# Patient Record
Sex: Female | Born: 1940 | Race: White | Hispanic: No | Marital: Married | Smoking: Current every day smoker
Health system: Southern US, Community
[De-identification: ages and names within clinical notes are randomized; demographics above are authoritative.]

## PROBLEM LIST (undated history)

## (undated) DIAGNOSIS — I10 Essential (primary) hypertension: Secondary | ICD-10-CM

## (undated) DIAGNOSIS — F419 Anxiety disorder, unspecified: Secondary | ICD-10-CM

## (undated) DIAGNOSIS — I428 Other cardiomyopathies: Secondary | ICD-10-CM

## (undated) DIAGNOSIS — E7800 Pure hypercholesterolemia, unspecified: Secondary | ICD-10-CM

## (undated) DIAGNOSIS — Z86718 Personal history of other venous thrombosis and embolism: Secondary | ICD-10-CM

## (undated) DIAGNOSIS — I509 Heart failure, unspecified: Secondary | ICD-10-CM

## (undated) DIAGNOSIS — J449 Chronic obstructive pulmonary disease, unspecified: Secondary | ICD-10-CM

## (undated) DIAGNOSIS — I219 Acute myocardial infarction, unspecified: Secondary | ICD-10-CM

## (undated) DIAGNOSIS — I2510 Atherosclerotic heart disease of native coronary artery without angina pectoris: Secondary | ICD-10-CM

## (undated) DIAGNOSIS — F329 Major depressive disorder, single episode, unspecified: Secondary | ICD-10-CM

## (undated) HISTORY — PX: CARDIAC DEFIBRILLATOR PLACEMENT: SHX171

## (undated) HISTORY — PX: SPLENECTOMY: SUR1306

## (undated) HISTORY — DX: Heart failure, unspecified: I50.9

## (undated) HISTORY — DX: Pure hypercholesterolemia, unspecified: E78.00

## (undated) HISTORY — DX: Personal history of other venous thrombosis and embolism: Z86.718

## (undated) HISTORY — DX: Atherosclerotic heart disease of native coronary artery without angina pectoris: I25.10

## (undated) HISTORY — DX: Anxiety disorder, unspecified: F41.9

## (undated) HISTORY — DX: Other cardiomyopathies: I42.8

## (undated) HISTORY — DX: Essential (primary) hypertension: I10

## (undated) HISTORY — DX: Acute myocardial infarction, unspecified: I21.9

## (undated) HISTORY — DX: Chronic obstructive pulmonary disease, unspecified: J44.9

## (undated) HISTORY — DX: Major depressive disorder, single episode, unspecified: F32.9

## (undated) HISTORY — PX: ESOPHAGUS SURGERY: SHX626

## (undated) HISTORY — PX: CHOLECYSTECTOMY: SHX55

## (undated) HISTORY — PX: OTHER SURGICAL HISTORY: SHX169

## (undated) HISTORY — PX: TOTAL ABDOMINAL HYSTERECTOMY: SHX209

---

## 2005-09-24 ENCOUNTER — Ambulatory Visit: Payer: Self-pay | Admitting: Internal Medicine

## 2005-11-03 HISTORY — PX: TRANSTHORACIC ECHOCARDIOGRAM: SHX275

## 2005-11-20 ENCOUNTER — Inpatient Hospital Stay (HOSPITAL_COMMUNITY): Admission: EM | Admit: 2005-11-20 | Discharge: 2005-11-23 | Payer: Self-pay | Admitting: Emergency Medicine

## 2005-11-21 ENCOUNTER — Encounter (INDEPENDENT_AMBULATORY_CARE_PROVIDER_SITE_OTHER): Payer: Self-pay | Admitting: Cardiology

## 2006-01-07 ENCOUNTER — Ambulatory Visit: Payer: Self-pay | Admitting: Internal Medicine

## 2006-04-09 ENCOUNTER — Ambulatory Visit: Payer: Self-pay | Admitting: Internal Medicine

## 2006-07-09 ENCOUNTER — Emergency Department (HOSPITAL_COMMUNITY): Admission: EM | Admit: 2006-07-09 | Discharge: 2006-07-09 | Payer: Self-pay | Admitting: Emergency Medicine

## 2006-08-11 ENCOUNTER — Ambulatory Visit: Payer: Self-pay | Admitting: Internal Medicine

## 2007-02-09 ENCOUNTER — Ambulatory Visit: Payer: Self-pay | Admitting: Internal Medicine

## 2007-03-29 ENCOUNTER — Emergency Department (HOSPITAL_COMMUNITY): Admission: EM | Admit: 2007-03-29 | Discharge: 2007-03-29 | Payer: Self-pay | Admitting: Emergency Medicine

## 2007-06-06 ENCOUNTER — Emergency Department (HOSPITAL_COMMUNITY): Admission: EM | Admit: 2007-06-06 | Discharge: 2007-06-06 | Payer: Self-pay | Admitting: Emergency Medicine

## 2007-08-12 DIAGNOSIS — F17200 Nicotine dependence, unspecified, uncomplicated: Secondary | ICD-10-CM

## 2007-08-12 DIAGNOSIS — J449 Chronic obstructive pulmonary disease, unspecified: Secondary | ICD-10-CM

## 2007-08-12 DIAGNOSIS — I428 Other cardiomyopathies: Secondary | ICD-10-CM | POA: Insufficient documentation

## 2007-08-12 DIAGNOSIS — I951 Orthostatic hypotension: Secondary | ICD-10-CM | POA: Insufficient documentation

## 2007-08-13 ENCOUNTER — Ambulatory Visit: Payer: Self-pay | Admitting: Internal Medicine

## 2007-08-25 ENCOUNTER — Observation Stay (HOSPITAL_COMMUNITY): Admission: EM | Admit: 2007-08-25 | Discharge: 2007-08-26 | Payer: Self-pay | Admitting: Emergency Medicine

## 2007-08-25 ENCOUNTER — Ambulatory Visit: Payer: Self-pay | Admitting: Internal Medicine

## 2007-08-28 ENCOUNTER — Emergency Department (HOSPITAL_COMMUNITY): Admission: EM | Admit: 2007-08-28 | Discharge: 2007-08-28 | Payer: Self-pay | Admitting: Emergency Medicine

## 2007-09-17 ENCOUNTER — Ambulatory Visit: Payer: Self-pay | Admitting: Internal Medicine

## 2007-09-20 ENCOUNTER — Emergency Department (HOSPITAL_COMMUNITY): Admission: EM | Admit: 2007-09-20 | Discharge: 2007-09-20 | Payer: Self-pay | Admitting: Family Medicine

## 2007-09-21 ENCOUNTER — Inpatient Hospital Stay (HOSPITAL_COMMUNITY): Admission: EM | Admit: 2007-09-21 | Discharge: 2007-09-23 | Payer: Self-pay | Admitting: Emergency Medicine

## 2007-11-30 ENCOUNTER — Telehealth (INDEPENDENT_AMBULATORY_CARE_PROVIDER_SITE_OTHER): Payer: Self-pay | Admitting: *Deleted

## 2007-12-04 ENCOUNTER — Ambulatory Visit: Payer: Self-pay | Admitting: Internal Medicine

## 2008-04-05 ENCOUNTER — Encounter: Payer: Self-pay | Admitting: Internal Medicine

## 2008-06-13 ENCOUNTER — Ambulatory Visit: Payer: Self-pay | Admitting: Internal Medicine

## 2008-07-19 ENCOUNTER — Inpatient Hospital Stay (HOSPITAL_COMMUNITY): Admission: EM | Admit: 2008-07-19 | Discharge: 2008-07-29 | Payer: Self-pay | Admitting: Emergency Medicine

## 2008-07-19 ENCOUNTER — Ambulatory Visit: Payer: Self-pay | Admitting: Pulmonary Disease

## 2008-07-19 ENCOUNTER — Ambulatory Visit: Payer: Self-pay | Admitting: Internal Medicine

## 2008-09-07 ENCOUNTER — Encounter: Payer: Self-pay | Admitting: Internal Medicine

## 2008-09-15 ENCOUNTER — Ambulatory Visit: Payer: Self-pay

## 2008-10-17 ENCOUNTER — Ambulatory Visit: Payer: Self-pay | Admitting: Internal Medicine

## 2008-10-17 ENCOUNTER — Encounter: Payer: Self-pay | Admitting: Internal Medicine

## 2008-10-17 DIAGNOSIS — I472 Ventricular tachycardia: Secondary | ICD-10-CM

## 2009-04-07 ENCOUNTER — Ambulatory Visit: Payer: Self-pay | Admitting: Internal Medicine

## 2009-04-10 DIAGNOSIS — Z95810 Presence of automatic (implantable) cardiac defibrillator: Secondary | ICD-10-CM | POA: Insufficient documentation

## 2009-05-04 ENCOUNTER — Emergency Department (HOSPITAL_COMMUNITY): Admission: EM | Admit: 2009-05-04 | Discharge: 2009-05-04 | Payer: Self-pay | Admitting: Emergency Medicine

## 2009-05-18 ENCOUNTER — Inpatient Hospital Stay (HOSPITAL_COMMUNITY): Admission: EM | Admit: 2009-05-18 | Discharge: 2009-05-18 | Payer: Self-pay | Admitting: Emergency Medicine

## 2009-06-09 ENCOUNTER — Telehealth: Payer: Self-pay | Admitting: Internal Medicine

## 2009-06-15 ENCOUNTER — Encounter: Payer: Self-pay | Admitting: Internal Medicine

## 2009-06-16 ENCOUNTER — Encounter: Payer: Self-pay | Admitting: Internal Medicine

## 2009-06-20 ENCOUNTER — Ambulatory Visit: Payer: Self-pay | Admitting: Internal Medicine

## 2009-07-12 ENCOUNTER — Ambulatory Visit: Payer: Self-pay | Admitting: Internal Medicine

## 2009-07-12 DIAGNOSIS — I4891 Unspecified atrial fibrillation: Secondary | ICD-10-CM

## 2009-07-13 LAB — CONVERTED CEMR LAB
Basophils Absolute: 0.2 10*3/uL — ABNORMAL HIGH (ref 0.0–0.1)
Calcium: 9.3 mg/dL (ref 8.4–10.5)
Creatinine, Ser: 0.6 mg/dL (ref 0.4–1.2)
GFR calc non Af Amer: 105.17 mL/min (ref >60)
Glucose, Bld: 134 mg/dL — ABNORMAL HIGH (ref 70–99)
Hemoglobin: 14.7 g/dL (ref 12.0–15.0)
INR: 1 (ref 0.8–1.0)
Lymphocytes Relative: 19.8 % (ref 12.0–46.0)
Monocytes Relative: 6.6 % (ref 3.0–12.0)
Neutro Abs: 11 10*3/uL — ABNORMAL HIGH (ref 1.4–7.7)
Neutrophils Relative %: 70.9 % (ref 43.0–77.0)
Platelets: 439 10*3/uL — ABNORMAL HIGH (ref 150.0–400.0)
Prothrombin Time: 9.9 s (ref 9.1–11.7)
RDW: 12.6 % (ref 11.5–14.6)
Sodium: 137 meq/L (ref 135–145)

## 2009-07-19 ENCOUNTER — Ambulatory Visit (HOSPITAL_COMMUNITY): Admission: RE | Admit: 2009-07-19 | Discharge: 2009-07-19 | Payer: Self-pay | Admitting: Internal Medicine

## 2009-07-19 ENCOUNTER — Ambulatory Visit: Payer: Self-pay | Admitting: Internal Medicine

## 2009-07-21 ENCOUNTER — Encounter: Payer: Self-pay | Admitting: Internal Medicine

## 2009-08-02 ENCOUNTER — Ambulatory Visit: Payer: Self-pay

## 2009-08-02 ENCOUNTER — Encounter: Payer: Self-pay | Admitting: Internal Medicine

## 2009-08-10 ENCOUNTER — Ambulatory Visit: Payer: Self-pay | Admitting: Hematology and Oncology

## 2009-08-22 ENCOUNTER — Encounter: Payer: Self-pay | Admitting: Internal Medicine

## 2009-08-22 LAB — CBC WITH DIFFERENTIAL/PLATELET
BASO%: 0.5 % (ref 0.0–2.0)
Eosinophils Absolute: 0.5 10*3/uL (ref 0.0–0.5)
MCHC: 34 g/dL (ref 31.5–36.0)
MONO#: 2 10*3/uL — ABNORMAL HIGH (ref 0.1–0.9)
NEUT#: 12.1 10*3/uL — ABNORMAL HIGH (ref 1.5–6.5)
RBC: 4.55 10*6/uL (ref 3.70–5.45)
RDW: 13.6 % (ref 11.2–14.5)
WBC: 18.6 10*3/uL — ABNORMAL HIGH (ref 3.9–10.3)
lymph#: 4 10*3/uL — ABNORMAL HIGH (ref 0.9–3.3)

## 2009-08-22 LAB — MORPHOLOGY: RBC Comments: NORMAL

## 2009-08-26 LAB — COMPREHENSIVE METABOLIC PANEL
ALT: 19 U/L (ref 0–35)
AST: 17 U/L (ref 0–37)
Albumin: 4.2 g/dL (ref 3.5–5.2)
Alkaline Phosphatase: 101 U/L (ref 39–117)
BUN: 11 mg/dL (ref 6–23)
CO2: 25 mEq/L (ref 19–32)
Calcium: 9.5 mg/dL (ref 8.4–10.5)
Chloride: 98 mEq/L (ref 96–112)
Creatinine, Ser: 0.63 mg/dL (ref 0.40–1.20)
Glucose, Bld: 105 mg/dL — ABNORMAL HIGH (ref 70–99)
Potassium: 4.4 mEq/L (ref 3.5–5.3)
Sodium: 134 mEq/L — ABNORMAL LOW (ref 135–145)
Total Bilirubin: 1 mg/dL (ref 0.3–1.2)
Total Protein: 6.4 g/dL (ref 6.0–8.3)

## 2009-08-26 LAB — PROTEIN ELECTROPHORESIS, SERUM, WITH REFLEX
Albumin ELP: 61.1 % (ref 55.8–66.1)
Alpha-1-Globulin: 4.3 % (ref 2.9–4.9)
Alpha-2-Globulin: 12.1 % — ABNORMAL HIGH (ref 7.1–11.8)
Beta 2: 4.4 % (ref 3.2–6.5)
Beta Globulin: 7.9 % — ABNORMAL HIGH (ref 4.7–7.2)
Gamma Globulin: 10.2 % — ABNORMAL LOW (ref 11.1–18.8)
Total Protein, Serum Electrophoresis: 6.4 g/dL (ref 6.0–8.3)

## 2009-08-26 LAB — LEUKOCYTE ALKALINE PHOS: Leukocyte Alkaline  Phos Stain: 66

## 2009-08-26 LAB — JAK2 GENOTYPR: JAK2 GenotypR: NOT DETECTED

## 2009-08-31 LAB — BCR/ABL

## 2009-09-06 ENCOUNTER — Encounter: Payer: Self-pay | Admitting: Internal Medicine

## 2009-09-06 LAB — CBC WITH DIFFERENTIAL/PLATELET
Basophils Absolute: 0.1 10*3/uL (ref 0.0–0.1)
EOS%: 2.7 % (ref 0.0–7.0)
HCT: 44.6 % (ref 34.8–46.6)
HGB: 14.8 g/dL (ref 11.6–15.9)
LYMPH%: 31 % (ref 14.0–49.7)
MCH: 32.4 pg (ref 25.1–34.0)
MCHC: 33.2 g/dL (ref 31.5–36.0)
MCV: 97.6 fL (ref 79.5–101.0)
NEUT%: 52.5 % (ref 38.4–76.8)
Platelets: 417 10*3/uL — ABNORMAL HIGH (ref 145–400)
lymph#: 4.5 10*3/uL — ABNORMAL HIGH (ref 0.9–3.3)

## 2009-10-03 ENCOUNTER — Ambulatory Visit: Payer: Self-pay | Admitting: Internal Medicine

## 2009-11-29 ENCOUNTER — Encounter: Admission: RE | Admit: 2009-11-29 | Discharge: 2009-11-29 | Payer: Self-pay | Admitting: Internal Medicine

## 2009-12-08 ENCOUNTER — Encounter: Admission: RE | Admit: 2009-12-08 | Discharge: 2009-12-08 | Payer: Self-pay | Admitting: Internal Medicine

## 2009-12-13 ENCOUNTER — Ambulatory Visit: Payer: Self-pay | Admitting: Hematology and Oncology

## 2009-12-14 ENCOUNTER — Encounter: Payer: Self-pay | Admitting: Internal Medicine

## 2009-12-14 LAB — BASIC METABOLIC PANEL
BUN: 6 mg/dL (ref 6–23)
CO2: 30 mEq/L (ref 19–32)
Chloride: 97 mEq/L (ref 96–112)
Glucose, Bld: 96 mg/dL (ref 70–99)
Potassium: 4.7 mEq/L (ref 3.5–5.3)
Sodium: 137 mEq/L (ref 135–145)

## 2009-12-14 LAB — CBC WITH DIFFERENTIAL/PLATELET
Basophils Absolute: 0 10*3/uL (ref 0.0–0.1)
Eosinophils Absolute: 0.3 10*3/uL (ref 0.0–0.5)
HGB: 15.6 g/dL (ref 11.6–15.9)
MCV: 97.9 fL (ref 79.5–101.0)
MONO#: 2.1 10*3/uL — ABNORMAL HIGH (ref 0.1–0.9)
NEUT#: 12.9 10*3/uL — ABNORMAL HIGH (ref 1.5–6.5)
RBC: 4.75 10*6/uL (ref 3.70–5.45)
RDW: 13.9 % (ref 11.2–14.5)
WBC: 19.1 10*3/uL — ABNORMAL HIGH (ref 3.9–10.3)
lymph#: 3.6 10*3/uL — ABNORMAL HIGH (ref 0.9–3.3)

## 2009-12-14 LAB — LACTATE DEHYDROGENASE: LDH: 157 U/L (ref 94–250)

## 2010-01-03 ENCOUNTER — Emergency Department (HOSPITAL_COMMUNITY): Admission: EM | Admit: 2010-01-03 | Discharge: 2010-01-03 | Payer: Self-pay | Admitting: Emergency Medicine

## 2010-01-03 ENCOUNTER — Telehealth: Payer: Self-pay | Admitting: Internal Medicine

## 2010-01-04 ENCOUNTER — Ambulatory Visit: Payer: Self-pay | Admitting: Internal Medicine

## 2010-01-10 ENCOUNTER — Ambulatory Visit: Payer: Self-pay | Admitting: Internal Medicine

## 2010-03-16 ENCOUNTER — Ambulatory Visit: Payer: Self-pay | Admitting: Cardiovascular Disease

## 2010-03-20 ENCOUNTER — Telehealth: Payer: Self-pay | Admitting: Internal Medicine

## 2010-03-21 ENCOUNTER — Ambulatory Visit: Payer: Self-pay

## 2010-04-11 ENCOUNTER — Ambulatory Visit: Payer: Self-pay | Admitting: Internal Medicine

## 2010-07-09 ENCOUNTER — Ambulatory Visit: Payer: Self-pay | Admitting: Internal Medicine

## 2010-07-10 ENCOUNTER — Ambulatory Visit: Payer: Self-pay | Admitting: Hematology and Oncology

## 2010-07-11 ENCOUNTER — Encounter: Payer: Self-pay | Admitting: Internal Medicine

## 2010-07-11 LAB — CBC WITH DIFFERENTIAL/PLATELET
BASO%: 1.2 % (ref 0.0–2.0)
Basophils Absolute: 0.2 10*3/uL — ABNORMAL HIGH (ref 0.0–0.1)
EOS%: 3.6 % (ref 0.0–7.0)
HGB: 14.9 g/dL (ref 11.6–15.9)
MCH: 32 pg (ref 25.1–34.0)
MCHC: 33.9 g/dL (ref 31.5–36.0)
MCV: 94.4 fL (ref 79.5–101.0)
MONO%: 13.5 % (ref 0.0–14.0)
NEUT%: 64.2 % (ref 38.4–76.8)
RDW: 13.6 % (ref 11.2–14.5)

## 2010-08-13 ENCOUNTER — Emergency Department (HOSPITAL_COMMUNITY)
Admission: EM | Admit: 2010-08-13 | Discharge: 2010-08-13 | Payer: Self-pay | Source: Home / Self Care | Admitting: Emergency Medicine

## 2010-08-20 LAB — DIFFERENTIAL
Basophils Absolute: 0 10*3/uL (ref 0.0–0.1)
Basophils Relative: 0 % (ref 0–1)
Eosinophils Absolute: 0.1 10*3/uL (ref 0.0–0.7)
Eosinophils Relative: 0 % (ref 0–5)
Lymphocytes Relative: 19 % (ref 12–46)
Lymphs Abs: 4.3 10*3/uL — ABNORMAL HIGH (ref 0.7–4.0)
Monocytes Absolute: 2.6 10*3/uL — ABNORMAL HIGH (ref 0.1–1.0)
Monocytes Relative: 11 % (ref 3–12)
Neutro Abs: 15.7 10*3/uL — ABNORMAL HIGH (ref 1.7–7.7)
Neutrophils Relative %: 69 % (ref 43–77)

## 2010-08-20 LAB — URINALYSIS, ROUTINE W REFLEX MICROSCOPIC
Bilirubin Urine: NEGATIVE
Hgb urine dipstick: NEGATIVE
Ketones, ur: NEGATIVE mg/dL
Nitrite: NEGATIVE
Protein, ur: NEGATIVE mg/dL
Specific Gravity, Urine: 1.013 (ref 1.005–1.030)
Urine Glucose, Fasting: NEGATIVE mg/dL
Urobilinogen, UA: 0.2 mg/dL (ref 0.0–1.0)
pH: 6.5 (ref 5.0–8.0)

## 2010-08-20 LAB — CBC
HCT: 47.9 % — ABNORMAL HIGH (ref 36.0–46.0)
Hemoglobin: 16.3 g/dL — ABNORMAL HIGH (ref 12.0–15.0)
MCH: 31.2 pg (ref 26.0–34.0)
MCHC: 34 g/dL (ref 30.0–36.0)
MCV: 91.8 fL (ref 78.0–100.0)
Platelets: 442 10*3/uL — ABNORMAL HIGH (ref 150–400)
RBC: 5.22 MIL/uL — ABNORMAL HIGH (ref 3.87–5.11)
RDW: 13.5 % (ref 11.5–15.5)
WBC: 22.6 10*3/uL — ABNORMAL HIGH (ref 4.0–10.5)

## 2010-08-20 LAB — BASIC METABOLIC PANEL
BUN: 13 mg/dL (ref 6–23)
CO2: 29 mEq/L (ref 19–32)
Calcium: 8.6 mg/dL (ref 8.4–10.5)
Chloride: 91 mEq/L — ABNORMAL LOW (ref 96–112)
Creatinine, Ser: 0.84 mg/dL (ref 0.4–1.2)
GFR calc Af Amer: >60 mL/min (ref >60)
GFR calc non Af Amer: >60 mL/min (ref >60)
Glucose, Bld: 102 mg/dL — ABNORMAL HIGH (ref 70–99)
Potassium: 4.7 mEq/L (ref 3.5–5.1)
Sodium: 131 mEq/L — ABNORMAL LOW (ref 135–145)

## 2010-08-20 LAB — DIGOXIN LEVEL: Digoxin Level: 0.4 ng/mL — ABNORMAL LOW (ref 0.8–2.0)

## 2010-08-20 LAB — OCCULT BLOOD, POC DEVICE: Fecal Occult Bld: POSITIVE

## 2010-08-26 ENCOUNTER — Encounter: Payer: Self-pay | Admitting: Internal Medicine

## 2010-09-04 NOTE — Assessment & Plan Note (Signed)
Summary: f6m/per Bjorn Loser      Allergies Added:   Primary Provider:  Renda Rolls   History of Present Illness: [name] returns today for ICD followup.  She is a pleasant 70 yo woman with an ICM, VT, CHF and ongoing tobacco abuse.  She has longstanding COPD.  She continues to have class 2 CHF symptoms which I think are multifactorial.  No other complaints today.  She has had no intercurrent ICD therapies.  She has undergone ICD generator replacement in 12/10.    Current Medications (verified): 1)  Spiriva Handihaler 18 Mcg  Caps (Tiotropium Bromide Monohydrate) .... Inhale Contents of 1 Capsule Once A Day 2)  Coreg 25 Mg  Tabs (Carvedilol) .... Two Times A Day 3)  Digoxin 0.25 Mg  Tabs (Digoxin) .... Take 1 Tablet By Mouth Once A Day 4)  Spironolactone 25 Mg  Tabs (Spironolactone) .... Take One Tablet By Mouth Every Other Day 5)  Bayer Low Strength 81 Mg  Tbec (Aspirin) .... Take 1 Tablet By Mouth Once A Day 6)  Duoneb 2.5-0.5 Mg/53ml  Soln (Albuterol-Ipratropium) .... 2-3 Times A Day 7)  Cozaar 25 Mg Tabs (Losartan Potassium) .... Take One Tablet By Mouth Daily 8)  Ventolin Hfa 108 (90 Base) Mcg/act  Aers (Albuterol Sulfate) .... As Needed 9)  Advair Diskus 250-50 Mcg/dose Misc (Fluticasone-Salmeterol) .... 2 Puffs Bid 10)  Citalopram Hydrobromide 10 Mg Tabs (Citalopram Hydrobromide) .Marland Kitchen.. 1qd 11)  Omeprazole 20 Mg Tbec (Omeprazole) .... Take 1 Tablet By Mouth Once A Day 12)  Omega-3 Fish Oil 1000 Mg Caps (Omega-3 Fatty Acids) .... Take 1 Capsule By Mouth Three Times A Day 13)  Lipitor 10 Mg Tabs (Atorvastatin Calcium) .... Take One Tablet By Mouth Daily. 14)  Lorazepam 0.5 Mg Tabs (Lorazepam) .... One Tablet Daily As Needed 15)  O2 2.5 L Qhs .... Qhs  Allergies (verified): 1)  ! * No Ivp Dye Allergy 2)  ! * No Shellfish Allergy 3)  ! * No Latex Allergy  Past History:  Past Medical History: Last updated: 10/17/2008 1.  COPD.  2.  Tobacco use.  3.  CAD.  4.  CHF.  EF of  25%.  Cardiologist was Dr. Jenne Campus.  She had a recent echo      done.  Also had a pacemaker placed and the pacemaker check was recent.  Past Surgical History: Last updated: 10/14/2008 1.  Status post pacemaker ablation.  2.  Gallbladder.  3.  Total abdominal hysterectomy.  4.  Splenectomy.  5.  Esophageal surgery for acid reflux disease.  Review of Systems  The patient denies chest pain, syncope, dyspnea on exertion, and peripheral edema.    Vital Signs:  Patient profile:   70 year old female Height:      65 inches Weight:      124 pounds BMI:     20.71 Pulse rate:   40 / minute Resp:     16 per minute BP sitting:   118 / 58  (right arm)  Vitals Entered By: Marrion Coy, CNA (October 03, 2009 2:14 PM)  Physical Exam  General:  Well developed, well nourished, in no acute distress. Head:  normocephalic and atraumatic Eyes:  PERRLA/EOM intact; conjunctiva and lids normal. Mouth:  Teeth, gums and palate normal. Oral mucosa normal. Neck:  Neck supple, no JVD. No masses, thyromegaly or abnormal cervical nodes. Chest Wall:  Well healed ICD incision. Lungs:  Decreased breath sounds throughout with no wheezes, or rhonchi.  No increased work of breathing. Heart:  IRRR with normal S1 and S2.  PMI is enlarged and laterally displaced.  A soft systolic murmur is present at the base. Abdomen:  Bowel sounds positive; abdomen soft and non-tender without masses, organomegaly, or hernias noted. No hepatosplenomegaly. Msk:  Back normal, normal gait. Muscle strength and tone normal. Pulses:  pulses normal in all 4 extremities Extremities:  No clubbing or cyanosis.  No edema. Neurologic:  Alert and oriented x 3.    ICD Specifications Following MD:  Lewayne Bunting, MD     ICD Vendor:  [facility] Jude     ICD Model Number:  [id number]     ICD Serial Number:  [id number] ICD DOI:  07/19/2009     ICD Implanting MD:  Lewayne Bunting, MD  Lead 1:    Location: RA     DOI: 11/06/2004     Model #: [id number]     Serial #:  [id number]     Status: active Lead 2:    Location: RV     DOI: 11/06/2004     Model #: [id number]     Serial #: [id number]     Status: active  Indications::  VT  Explantation Comments: 07/19/2009 [employer] Vitality T125/114081 explanted  ICD Follow Up Remote Check?  No Battery Voltage:  >95% V     Charge Time:  7.9 seconds     Battery Est. Longevity:  9.1 YEARS Underlying rhythm:  SR WITH PVC'S ICD Dependent:  No       ICD Device Measurements Atrium:  Amplitude: 1.9 mV, Impedance: 360 ohms, Threshold: 0.75 V at 0.5 msec Right Ventricle:  Amplitude: 11.7 mV, Impedance: 630 ohms, Threshold: 0.75 V at 0.5 msec Shock Impedance: 53 ohms   Episodes MS Episodes:  1     Percent Mode Switch:  <1%     Coumadin:  No Shock:  0     ATP:  0     Nonsustained:  0     Atrial Pacing:  7.6%     Ventricular Pacing:  <1%  Brady Parameters Mode DDD     Lower Rate Limit:  50     Upper Rate Limit 120 PAV 200     Sensed AV Delay:  200  Tachy Zones VF:  200     VT:  176     Next Cardiology Appt Due:  01/03/2010 Tech Comments:  Normal device function.  No changes made today.  Pt with 16% PVC's.  Pt prefers office visits to [employer]. ROV 3 months device clinic. Lindsey Balsam RN BSN  October 03, 2009 2:25 PM  MD Comments:  Agree with above.  Impression & Recommendations:  Problem # 1:  AUTOMATIC IMPLANTABLE CARDIAC DEFIBRILLATOR SITU (ICD-V45.02) Her device is working normally.  Will recheck in several months.  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 Her updated medication list for this problem includes:    Coreg 25 Mg Tabs (Carvedilol) .Marland Kitchen..Marland Kitchen Two times a day    Bayer Low Strength 81 Mg Tbec (Aspirin) .Marland Kitchen... Take 1 tablet by mouth once a day  Problem # 3:  ATRIAL FIBRILLATION (ICD-427.31) She has had no recurrent symptoms.  Continue current meds. Her updated medication list for this problem includes:    Coreg 25 Mg  Tabs (Carvedilol) .Marland Kitchen..Marland Kitchen Two times a day    Digoxin 0.25 Mg Tabs (Digoxin) .Marland Kitchen... Take 1 tablet  by mouth once a day    Bayer Low Strength 81 Mg Tbec (Aspirin) .Marland Kitchen... Take 1 tablet by mouth once a day  Patient Instructions: 1)  Your physician recommends that you schedule a follow-up appointment in: 3 months with device clinic

## 2010-09-04 NOTE — Procedures (Signed)
Summary: DEFIB CHECK./CY  Medications Added DALIRESP 500 MCG TABS (ROFLUMILAST) Take 1 tablet by mouth at bedtime      Allergies Added:   Current Medications (verified): 1)  Spiriva Handihaler 18 Mcg  Caps (Tiotropium Bromide Monohydrate) .... Inhale Contents of 1 Capsule Once A Day 2)  Coreg 25 Mg  Tabs (Carvedilol) .... Two Times A Day 3)  Digoxin 0.25 Mg  Tabs (Digoxin) .... Take 1 Tablet By Mouth Once A Day 4)  Spironolactone 25 Mg  Tabs (Spironolactone) .... Take One Tablet By Mouth Every Other Day 5)  Bayer Low Strength 81 Mg  Tbec (Aspirin) .... Take 1 Tablet By Mouth Once A Day 6)  Duoneb 2.5-0.5 Mg/28ml  Soln (Albuterol-Ipratropium) .... 2-3 Times A Day 7)  Cozaar 25 Mg Tabs (Losartan Potassium) .... Take One Tablet Once Daily and One At Night As Needed 8)  Ventolin Hfa 108 (90 Base) Mcg/act  Aers (Albuterol Sulfate) .... As Needed 9)  Advair Diskus 500-50 Mcg/dose Aepb (Fluticasone-Salmeterol) .... Uad 10)  Citalopram Hydrobromide 10 Mg Tabs (Citalopram Hydrobromide) .Marland Kitchen.. 1qd 11)  Omeprazole 20 Mg Tbec (Omeprazole) .... Take 1 Tablet By Mouth Once A Day 12)  Omega-3 Fish Oil 1000 Mg Caps (Omega-3 Fatty Acids) .... Take 1 Capsule By Mouth Three Times A Day 13)  Lipitor 10 Mg Tabs (Atorvastatin Calcium) .... Take One Tablet By Mouth Daily. 14)  Lorazepam 0.5 Mg Tabs (Lorazepam) .... One Tablet Daily As Needed 15)  O2 2.5 L Qhs .... Qhs 16)  Daliresp 500 Mcg Tabs (Roflumilast) .... Take 1 Tablet By Mouth At Bedtime  Allergies (verified): 1)  ! * No Ivp Dye Allergy 2)  ! * No Shellfish Allergy 3)  ! * No Latex Allergy   ICD Specifications Following MD:  Lewayne Bunting, MD     ICD Vendor:  [employer]     ICD Model Number:  [id number]     ICD Serial Number:  [id number] ICD DOI:  07/19/2009     ICD Implanting MD:  Lewayne Bunting, MD  Lead 1:    Location: RA     DOI: 11/06/2004     Model #: 4469     Serial #: [id number]     Status: active Lead 2:    Location: RV     DOI: 11/06/2004     Model #:  [id number]     Serial #: [id number]     Status: active  Indications::  VT  Explantation Comments: 07/19/2009 [employer] Vitality T125/114081 explanted  ICD Follow Up Battery Voltage:  89% V     Charge Time:  8.9 seconds     Battery Est. Longevity:  7.4-8.5 yrs Underlying rhythm:  SR ICD Dependent:  No       ICD Device Measurements Atrium:  Amplitude: 5.0 mV, Impedance: 360 ohms, Threshold: 0.5 V at 0.5 msec Right Ventricle:  Amplitude: 11.7 mV, Impedance: 510 ohms, Threshold: 1.0 V at 0.5 msec Shock Impedance: 53 ohms   Episodes MS Episodes:  11     Percent Mode Switch:  <1%     Coumadin:  No Shock:  0     ATP:  1     Nonsustained:  0     Atrial Therapies:  0 Atrial Pacing:  1.3%     Ventricular Pacing:  <1%  Brady Parameters Mode DDD     Lower Rate Limit:  50     Upper Rate Limit 120 PAV 200     Sensed AV Delay:  200  Tachy Zones VF:  200     VT:  176     Next Cardiology Appt Due:  10/04/2010 Tech Comments:  11 AMS EPISODES--LONGEST WAS 12 SECONDS.  1 VF EPISODE LASTING 7 SECONDS W/ATP THERAPY.  PT DOESNT REMEMBER ANY SYMPTOMS W/EPISODE.  NORMAL DEVICE FUNCTION.  NO CHANGES MADE. ROV IN 3 MTHS W/DEVICE CLINIC. Vella Kohler  July 09, 2010 2:43 PM

## 2010-09-04 NOTE — Progress Notes (Signed)
Summary: Calling regarding B/P     Phone Note Call from Patient Call back at Home Phone [phone]   Caller: Patient Summary of Call: Pt have trouble with B/P running high 180/110 in left arm and low at times90/50 Initial call taken by: Judie Grieve,  March 20, 2010 3:11 PM  Follow-up for Phone Call        BP spikes  gets hot all over BP going as high as 191/109 -  BP now 144/94  has not had this problem until she got the new device and has had these issues now for about one month.  Will discuss with MD and call pt back.  She is agreeable Follow-up by: Charolotte Capuchin, RN,  March 20, 2010 4:20 PM  Additional Follow-up for Phone Call Additional follow up Details #1::        Reviewed information with Dr Myrtis Ser who would like for pt to come in for a nurse room bp check.  Pt aware and has appt for 10:30am 03/21/2010 Additional Follow-up by: Charolotte Capuchin, RN,  March 20, 2010 6:07 PM

## 2010-09-04 NOTE — Cardiovascular Report (Signed)
Summary: Office Visit   Office Visit   Imported By: Roderic Ovens 04/17/2010 13:07:01  _____________________________________________________________________  External Attachment:    Type:   Image     Comment:   External Document

## 2010-09-04 NOTE — Letter (Signed)
[facility]   Imported By: Roderic Ovens 09/06/2009 11:24:00  _____________________________________________________________________  External Attachment:    Type:   Image     Comment:   External Document

## 2010-09-04 NOTE — Assessment & Plan Note (Signed)
Summary: DF2/SL  Medications Added COZAAR 25 MG TABS (LOSARTAN POTASSIUM) Take one tablet once daily and one at night as needed      Allergies Added:   Primary Provider:  Renda Rolls  CC:  df2/pt states she has no new cardiac concerns at this time.  History of Present Illness: Ms. Schelling returns today for followup.  She has a h/o DCM, HTN, and VT.  She denies any ICD shocks. She denies c/p or sob. She admits to dietary indiscretion.  No peripheral edema. No syncope.  Current Medications (verified): 1)  Spiriva Handihaler 18 Mcg  Caps (Tiotropium Bromide Monohydrate) .... Inhale Contents of 1 Capsule Once A Day 2)  Coreg 25 Mg  Tabs (Carvedilol) .... Two Times A Day 3)  Digoxin 0.25 Mg  Tabs (Digoxin) .... Take 1 Tablet By Mouth Once A Day 4)  Spironolactone 25 Mg  Tabs (Spironolactone) .... Take One Tablet By Mouth Every Other Day 5)  Bayer Low Strength 81 Mg  Tbec (Aspirin) .... Take 1 Tablet By Mouth Once A Day 6)  Duoneb 2.5-0.5 Mg/67ml  Soln (Albuterol-Ipratropium) .... 2-3 Times A Day 7)  Cozaar 25 Mg Tabs (Losartan Potassium) .... Take One Tablet Once Daily and One At Night As Needed 8)  Ventolin Hfa 108 (90 Base) Mcg/act  Aers (Albuterol Sulfate) .... As Needed 9)  Advair Diskus 500-50 Mcg/dose Aepb (Fluticasone-Salmeterol) .... Uad 10)  Citalopram Hydrobromide 10 Mg Tabs (Citalopram Hydrobromide) .Marland Kitchen.. 1qd 11)  Omeprazole 20 Mg Tbec (Omeprazole) .... Take 1 Tablet By Mouth Once A Day 12)  Omega-3 Fish Oil 1000 Mg Caps (Omega-3 Fatty Acids) .... Take 1 Capsule By Mouth Three Times A Day 13)  Lipitor 10 Mg Tabs (Atorvastatin Calcium) .... Take One Tablet By Mouth Daily. 14)  Lorazepam 0.5 Mg Tabs (Lorazepam) .... One Tablet Daily As Needed 15)  O2 2.5 L Qhs .... Qhs  Allergies (verified): 1)  ! * No Ivp Dye Allergy 2)  ! * No Shellfish Allergy 3)  ! * No Latex Allergy  Past History:  Past Medical History: Last updated: 10/17/2008 1.  COPD.  2.  Tobacco use.  3.   CAD.  4.  CHF.  EF of 25%.  Cardiologist was Dr. Jenne Campus.  She had a recent echo      done.  Also had a pacemaker placed and the pacemaker check was recent.  Past Surgical History: Last updated: 10/14/2008 1.  Status post pacemaker ablation.  2.  Gallbladder.  3.  Total abdominal hysterectomy.  4.  Splenectomy.  5.  Esophageal surgery for acid reflux disease.  Review of Systems  The patient denies chest pain, syncope, dyspnea on exertion, and peripheral edema.    Vital Signs:  Patient profile:   70 year old female Height:      65 inches Weight:      126 pounds BMI:     21.04 Pulse rate:   73 / minute Pulse rhythm:   regular BP sitting:   116 / 58  (right arm) Cuff size:   regular  Vitals Entered By: Judithe Modest CMA (April 11, 2010 9:00 AM)  Physical Exam  General:  Well developed, well nourished, in no acute distress. Head:  normocephalic and atraumatic Eyes:  PERRLA/EOM intact; conjunctiva and lids normal. Mouth:  Teeth, gums and palate normal. Oral mucosa normal. Neck:  Neck supple, no JVD. No masses, thyromegaly or abnormal cervical nodes. Chest Wall:  Well healed ICD incision. Lungs:  Decreased breath  sounds throughout with no wheezes, or rhonchi.  No increased work of breathing. Heart:  IRRR with normal S1 and S2.  PMI is enlarged and laterally displaced.  A soft systolic murmur is present at the base. Abdomen:  Bowel sounds positive; abdomen soft and non-tender without masses, organomegaly, or hernias noted. No hepatosplenomegaly. Msk:  Back normal, normal gait. Muscle strength and tone normal. Pulses:  pulses normal in all 4 extremities Extremities:  No clubbing or cyanosis.  No edema. Neurologic:  Alert and oriented x 3.    ICD Specifications Following MD:  Lewayne Bunting, MD     ICD Vendor:  [facility] Jude     ICD Model Number:  [id number]     ICD Serial Number:  [id number] ICD DOI:  07/19/2009     ICD Implanting MD:  Lewayne Bunting, MD  Lead 1:    Location: RA      DOI: 11/06/2004     Model #: 4469     Serial #: [id number]     Status: active Lead 2:    Location: RV     DOI: 11/06/2004     Model #: [id number]     Serial #: [id number]     Status: active  Indications::  VT  Explantation Comments: 07/19/2009 [employer] Vitality T125/114081 explanted  ICD Follow Up Remote Check?  No Charge Time:  8.9 seconds     Battery Est. Longevity:  7.7 years Underlying rhythm:  SR ICD Dependent:  No       ICD Device Measurements Atrium:  Amplitude: 4.2 mV, Impedance: 390 ohms, Threshold: 0.75 V at 0.5 msec Right Ventricle:  Amplitude: 11.7 mV, Impedance: 600 ohms, Threshold: 0.75 V at 0.5 msec Shock Impedance: 55 ohms   Episodes MS Episodes:  15     Percent Mode Switch:  <1%     Coumadin:  No Shock:  0     ATP:  0     Nonsustained:  0     Atrial Pacing:  2.3%     Ventricular Pacing:  <1%  Brady Parameters Mode DDD     Lower Rate Limit:  50     Upper Rate Limit 120 PAV 200     Sensed AV Delay:  200  Tachy Zones VF:  200     VT:  176     Next Cardiology Appt Due:  07/05/2010 Tech Comments:  No parameter changes.  Device function normal.  15 mode switch episodes the longest 16 seconds, - coumadin.  No Merlin @ this time.  ROV 3 months clinic. Altha Harm, LPN  April 11, 2010 9:18 AM  MD Comments:  Agree with above.  Impression & Recommendations:  Problem # 1:  AUTOMATIC IMPLANTABLE CARDIAC DEFIBRILLATOR SITU (ICD-V45.02) Her device is working normally. Will recheck in several months.  Problem # 2:  PAROXYSMAL VENTRICULAR TACHYCARDIA (ICD-427.1) She has had no recurrent symptoms.  Continue meds as below. Her updated medication list for this problem includes:    Coreg 25 Mg Tabs (Carvedilol) .Marland Kitchen..Marland Kitchen Two times a day    Bayer Low Strength 81 Mg Tbec (Aspirin) .Marland Kitchen... Take 1 tablet by mouth once a day  Problem # 3:  ATRIAL FIBRILLATION (ICD-427.31) She has had no symptomatic a.fib and her device interogation confirms the absence of atrial fib. Her updated  medication list for this problem includes:    Coreg 25 Mg Tabs (Carvedilol) .Marland Kitchen..Marland Kitchen Two times a day    Digoxin 0.25 Mg Tabs (Digoxin) .Marland Kitchen... Take 1  tablet by mouth once a day    Bayer Low Strength 81 Mg Tbec (Aspirin) .Marland Kitchen... Take 1 tablet by mouth once a day

## 2010-09-04 NOTE — Assessment & Plan Note (Signed)
Summary: bp check at 10:30  pt dr Myrtis Ser pfh,rn  Nurse Visit   Vital Signs:  Patient profile:   70 year old female Weight:      126 pounds Pulse rate:   65 / minute BP sitting:   98 / 60  (left arm)  Vitals Entered By: Meredith Staggers, RN (March 21, 2010 10:17 AM)  Current Medications (verified): 1)  Spiriva Handihaler 18 Mcg  Caps (Tiotropium Bromide Monohydrate) .... Inhale Contents of 1 Capsule Once A Day 2)  Coreg 25 Mg  Tabs (Carvedilol) .... Two Times A Day 3)  Digoxin 0.25 Mg  Tabs (Digoxin) .... Take 1 Tablet By Mouth Once A Day 4)  Spironolactone 25 Mg  Tabs (Spironolactone) .... Take One Tablet By Mouth Every Other Day 5)  Bayer Low Strength 81 Mg  Tbec (Aspirin) .... Take 1 Tablet By Mouth Once A Day 6)  Duoneb 2.5-0.5 Mg/46ml  Soln (Albuterol-Ipratropium) .... 2-3 Times A Day 7)  Cozaar 25 Mg Tabs (Losartan Potassium) .... Take One Tablet By Mouth Two Times A Day 8)  Ventolin Hfa 108 (90 Base) Mcg/act  Aers (Albuterol Sulfate) .... As Needed 9)  Advair Diskus 500-50 Mcg/dose Aepb (Fluticasone-Salmeterol) .... Uad 10)  Citalopram Hydrobromide 10 Mg Tabs (Citalopram Hydrobromide) .Marland Kitchen.. 1qd 11)  Omeprazole 20 Mg Tbec (Omeprazole) .... Take 1 Tablet By Mouth Once A Day 12)  Omega-3 Fish Oil 1000 Mg Caps (Omega-3 Fatty Acids) .... Take 1 Capsule By Mouth Three Times A Day 13)  Lipitor 10 Mg Tabs (Atorvastatin Calcium) .... Take One Tablet By Mouth Daily. 14)  Lorazepam 0.5 Mg Tabs (Lorazepam) .... One Tablet Daily As Needed 15)  O2 2.5 L Qhs .... Qhs  Allergies (verified): 1)  ! * No Ivp Dye Allergy 2)  ! * No Shellfish Allergy 3)  ! * No Latex Allergy  Impression & Recommendations: Pt in for BP check today she reports at home her BPs are up and down somedays it will be in the 180s like yest and other days it will be low, and it was low at home today, BP in office was 98/60 in left arm and 102/64 in right arm, she states Dr Eula Listen had check her home BP cuff and it was  accurate, no chest pain, no sob, no icd fires.  Pt will cont to monitor BP at home and keep a record, she will f/u w/Dr Eula Listen or Dr Jerrye Beavers, RN  March 21, 2010 10:53 AM

## 2010-09-04 NOTE — Progress Notes (Signed)
Summary: talk to nurse   Phone Note Call from Patient Call back at Home Phone [phone]   Caller: Patient Reason for Call: Talk to Nurse Summary of Call: pt had to go to the ER. pt thinks her Defib fired and BP was high. Not sure if pt needs an appt. Initial call taken by: Edman Circle,  January 03, 2010 12:11 PM  Follow-up for Phone Call        pt feels fine went to ER on 01/02/10. Device was not checked.  Will come in tomorrow and have device interrogated Dennis Bast, RN, BSN  January 03, 2010 4:31 PM

## 2010-09-04 NOTE — Letter (Signed)
[facility]   Imported By: Roderic Ovens 09/29/2009 11:15:53  _____________________________________________________________________  External Attachment:    Type:   Image     Comment:   External Document

## 2010-09-04 NOTE — Cardiovascular Report (Signed)
Summary: Office Visit   Office Visit   Imported By: Roderic Ovens 08/30/2009 11:43:15  _____________________________________________________________________  External Attachment:    Type:   Image     Comment:   External Document

## 2010-09-04 NOTE — Cardiovascular Report (Signed)
Summary: Office Visit   Office Visit   Imported By: Roderic Ovens 02/01/2010 12:44:26  _____________________________________________________________________  External Attachment:    Type:   Image     Comment:   External Document

## 2010-09-04 NOTE — Letter (Signed)
[facility]   Imported By: Marylou Mccoy 01/23/2010 11:11:11  _____________________________________________________________________  External Attachment:    Type:   Image     Comment:   External Document

## 2010-09-04 NOTE — Assessment & Plan Note (Signed)
Summary: rov/per paula/jml  Medications Added ADVAIR DISKUS 500-50 MCG/DOSE AEPB (FLUTICASONE-SALMETEROL) UAD      Allergies Added:   Visit Type:  Follow-up Primary Provider:  Renda Rolls   History of Present Illness: Ms. Manninen returns today for followup.  She was in her usual state of health until a weak ago.  She suddenly felt like she was about to pass out and received an ICD shock.  She has felt well since then.  She denies c/p or sob. She admits to dietary indiscretion.  No peripheral edema.  Current Medications (verified): 1)  Spiriva Handihaler 18 Mcg  Caps (Tiotropium Bromide Monohydrate) .... Inhale Contents of 1 Capsule Once A Day 2)  Coreg 25 Mg  Tabs (Carvedilol) .... Two Times A Day 3)  Digoxin 0.25 Mg  Tabs (Digoxin) .... Take 1 Tablet By Mouth Once A Day 4)  Spironolactone 25 Mg  Tabs (Spironolactone) .... Take One Tablet By Mouth Every Other Day 5)  Bayer Low Strength 81 Mg  Tbec (Aspirin) .... Take 1 Tablet By Mouth Once A Day 6)  Duoneb 2.5-0.5 Mg/4ml  Soln (Albuterol-Ipratropium) .... 2-3 Times A Day 7)  Cozaar 25 Mg Tabs (Losartan Potassium) .... Take One Tablet By Mouth Daily 8)  Ventolin Hfa 108 (90 Base) Mcg/act  Aers (Albuterol Sulfate) .... As Needed 9)  Advair Diskus 500-50 Mcg/dose Aepb (Fluticasone-Salmeterol) .... Uad 10)  Citalopram Hydrobromide 10 Mg Tabs (Citalopram Hydrobromide) .Marland Kitchen.. 1qd 11)  Omeprazole 20 Mg Tbec (Omeprazole) .... Take 1 Tablet By Mouth Once A Day 12)  Omega-3 Fish Oil 1000 Mg Caps (Omega-3 Fatty Acids) .... Take 1 Capsule By Mouth Three Times A Day 13)  Lipitor 10 Mg Tabs (Atorvastatin Calcium) .... Take One Tablet By Mouth Daily. 14)  Lorazepam 0.5 Mg Tabs (Lorazepam) .... One Tablet Daily As Needed 15)  O2 2.5 L Qhs .... Qhs  Allergies (verified): 1)  ! * No Ivp Dye Allergy 2)  ! * No Shellfish Allergy 3)  ! * No Latex Allergy  Past History:  Past Medical History: Last updated: 10/17/2008 1.  COPD.  2.  Tobacco  use.  3.  CAD.  4.  CHF.  EF of 25%.  Cardiologist was Dr. Jenne Campus.  She had a recent echo      done.  Also had a pacemaker placed and the pacemaker check was recent.  Review of Systems  The patient denies chest pain, syncope, dyspnea on exertion, and peripheral edema.    Vital Signs:  Patient profile:   70 year old female Height:      65 inches Weight:      127 pounds Pulse rate:   71 / minute BP sitting:   118 / 70  (left arm)  Vitals Entered By: Laurance Flatten CMA (January 10, 2010 11:09 AM)  Physical Exam  General:  Well developed, well nourished, in no acute distress. Head:  normocephalic and atraumatic Eyes:  PERRLA/EOM intact; conjunctiva and lids normal. Mouth:  Teeth, gums and palate normal. Oral mucosa normal. Neck:  Neck supple, no JVD. No masses, thyromegaly or abnormal cervical nodes. Chest Wall:  Well healed ICD incision. Lungs:  Decreased breath sounds throughout with no wheezes, or rhonchi.  No increased work of breathing. Heart:  IRRR with normal S1 and S2.  PMI is enlarged and laterally displaced.  A soft systolic murmur is present at the base. Abdomen:  Bowel sounds positive; abdomen soft and non-tender without masses, organomegaly, or hernias noted. No hepatosplenomegaly.  Msk:  Back normal, normal gait. Muscle strength and tone normal. Pulses:  pulses normal in all 4 extremities Extremities:  No clubbing or cyanosis.  No edema. Neurologic:  Alert and oriented x 3.    ICD Specifications Following MD:  Lewayne Bunting, MD     ICD Vendor:  [facility] [employer]     ICD Model Number:  [phone]     ICD Serial Number:  [id number] ICD DOI:  07/19/2009     ICD Implanting MD:  Lewayne Bunting, MD  Lead 1:    Location: RA     DOI: 11/06/2004     Model #: [id number]     Serial #: [id number]     Status: active Lead 2:    Location: RV     DOI: 11/06/2004     Model #: [id number]     Serial #: [id number]     Status: active  Indications::  VT  Explantation Comments: 07/19/2009 [employer] Vitality [id number]/114081  explanted  ICD Follow Up Battery Voltage:  93% V     Charge Time:  8.3 seconds     Battery Est. Longevity:  7.9-9.97yrs Underlying rhythm:  SR ICD Dependent:  No       ICD Device Measurements Atrium:  Amplitude: 3.0 mV, Impedance: 390 ohms,  Right Ventricle:  Amplitude: 8.8 mV, Impedance: 600 ohms,  Shock Impedance: 57 ohms   Episodes MS Episodes:  0     Percent Mode Switch:  0     Coumadin:  No Shock:  0     ATP:  0     Nonsustained:  0     Atrial Therapies:  0 Atrial Pacing:  2.9     Ventricular Pacing:  <1%  Brady Parameters Mode DDD     Lower Rate Limit:  50     Upper Rate Limit 120 PAV 200     Sensed AV Delay:  200  Tachy Zones VF:  200     VT:  176     Next Cardiology Appt Due:  04/11/2010 Tech Comments:  NORMAL DEVICE FUNCTION.  NO EPISODES SINCE LAST CHECK ON 01-04-10.  NO CHANGES MADE. ROV 04-11-10 W/GT. Vella Kohler  January 10, 2010 11:21 AM MD Comments:  Reviewed strips of VT and will follow.  Impression & Recommendations:  Problem # 1:  AUTOMATIC IMPLANTABLE CARDIAC DEFIBRILLATOR SITU (ICD-V45.02) Her device is working normally.  Will recheck in 3 months.  Problem # 2:  PAROXYSMAL VENTRICULAR TACHYCARDIA (ICD-427.1) She has had one episode of very fast VT.  I will not start any rhythm meds today.  Will followup. Her updated medication list for this problem includes:    Coreg 25 Mg Tabs (Carvedilol) .Marland Kitchen..Marland Kitchen Two times a day    Bayer Low Strength 81 Mg Tbec (Aspirin) .Marland Kitchen... Take 1 tablet by mouth once a day  Problem # 3:  CORONARY HEART DISEASE (ICD-V17.3) No anginal symptoms.  Continue current meds.  Patient Instructions: 1)  Your physician recommends that you schedule a follow-up appointment in: 3 months with Dr Ladona Ridgel

## 2010-09-04 NOTE — Procedures (Signed)
Summary: df2  Medications Added VENTOLIN HFA 108 (90 BASE) MCG/ACT AERS (ALBUTEROL SULFATE) 2 puffs 4 times daily      Allergies Added:   Current Medications (verified): 1)  Spiriva Handihaler 18 Mcg  Caps (Tiotropium Bromide Monohydrate) .... Inhale Contents of 1 Capsule Once A Day 2)  Coreg 25 Mg  Tabs (Carvedilol) .... Two Times A Day 3)  Digoxin 0.25 Mg  Tabs (Digoxin) .... Take 1 Tablet By Mouth Once A Day 4)  Spironolactone 25 Mg  Tabs (Spironolactone) .... Take One Tablet By Mouth Every Other Day 5)  Bayer Low Strength 81 Mg  Tbec (Aspirin) .... Take 1 Tablet By Mouth Once A Day 6)  Duoneb 2.5-0.5 Mg/56ml  Soln (Albuterol-Ipratropium) .... 2-3 Times A Day 7)  Cozaar 25 Mg Tabs (Losartan Potassium) .... Take One Tablet By Mouth Daily 8)  Ventolin Hfa 108 (90 Base) Mcg/act  Aers (Albuterol Sulfate) .... As Needed 9)  Advair Diskus 250-50 Mcg/dose Misc (Fluticasone-Salmeterol) .... 2 Puffs Bid 10)  Citalopram Hydrobromide 10 Mg Tabs (Citalopram Hydrobromide) .Marland Kitchen.. 1qd 11)  Omeprazole 20 Mg Tbec (Omeprazole) .... Take 1 Tablet By Mouth Once A Day 12)  Omega-3 Fish Oil 1000 Mg Caps (Omega-3 Fatty Acids) .... Take 1 Capsule By Mouth Three Times A Day 13)  Lipitor 10 Mg Tabs (Atorvastatin Calcium) .... Take One Tablet By Mouth Daily. 14)  Lorazepam 0.5 Mg Tabs (Lorazepam) .... One Tablet Daily As Needed 15)  O2 2.5 L Qhs .... Qhs 16)  Ventolin Hfa 108 (90 Base) Mcg/act Aers (Albuterol Sulfate) .... 2 Puffs 4 Times Daily  Allergies (verified): 1)  ! * No Ivp Dye Allergy 2)  ! * No Shellfish Allergy 3)  ! * No Latex Allergy   ICD Specifications Following MD:  Lewayne Bunting, MD     ICD Vendor:  [employer]     ICD Model Number:  [phone]     ICD Serial Number:  [id number] ICD DOI:  07/19/2009     ICD Implanting MD:  Lewayne Bunting, MD  Lead 1:    Location: RA     DOI: 11/06/2004     Model #: [id number]     Serial #: [id number]     Status: active Lead 2:    Location: RV     DOI: 11/06/2004     Model #: [id number]      Serial #: [id number]     Status: active  Indications::  VT  Explantation Comments: 07/19/2009 [employer] Vitality T125/114081 explanted  ICD Follow Up Remote Check?  No Charge Time:  8.3 seconds     Battery Est. Longevity:  7.8 years Underlying rhythm:  SR ICD Dependent:  No       ICD Device Measurements Atrium:  Amplitude: 1.7 mV, Impedance: 360 ohms, Threshold: 0.5 V at 0.5 msec Right Ventricle:  Amplitude: 9.4 mV, Impedance: 550 ohms, Threshold: 0.75 V at 0.5 msec Shock Impedance: 52 ohms   Episodes MS Episodes:  12     Percent Mode Switch:  <1%     Coumadin:  No Shock:  1     ATP:  2     Nonsustained:  0     Atrial Pacing:  3.8%     Ventricular Pacing:  <1%  Brady Parameters Mode DDD     Lower Rate Limit:  50     Upper Rate Limit 120 PAV 200     Sensed AV Delay:  200  Tachy Zones VF:  200  VT:  176     Next Cardiology Appt Due:  04/05/2010 Tech Comments:  Ms. Flagler was seen today as an add on for near syncopal episode 5/31.  EMS was called and she was seen in the ED and released without the device being interrogated.  She had 2 epiodes of VT one treated successfully with ATP the otherATP unseccessful, 30J shock into sinus rhythm.  I will discuss this with Dr. Ladona Ridgel and schedule her for follow up with him in 3 months. Altha Harm, LPN  January 04, 9562 10:25 AM

## 2010-09-04 NOTE — Cardiovascular Report (Signed)
Summary: Office Note   Office Note   Imported By: Roderic Ovens 01/16/2010 13:19:27  _____________________________________________________________________  External Attachment:    Type:   Image     Comment:   External Document

## 2010-09-06 NOTE — Letter (Signed)
Summary: [facility]  [facility]   Imported By: Sherian Rein 07/20/2010 11:20:33  _____________________________________________________________________  External Attachment:    Type:   Image     Comment:   External Document

## 2010-09-06 NOTE — Cardiovascular Report (Signed)
Summary: Office Visit   Office Visit   Imported By: Roderic Ovens 07/16/2010 13:51:17  _____________________________________________________________________  External Attachment:    Type:   Image     Comment:   External Document

## 2010-10-08 ENCOUNTER — Encounter (INDEPENDENT_AMBULATORY_CARE_PROVIDER_SITE_OTHER): Payer: Medicare Other

## 2010-10-08 ENCOUNTER — Encounter: Payer: Self-pay | Admitting: Internal Medicine

## 2010-10-08 DIAGNOSIS — I472 Ventricular tachycardia: Secondary | ICD-10-CM

## 2010-10-16 NOTE — Cardiovascular Report (Signed)
Summary: Office Visit    Office Visit   Imported By: Roderic Ovens 10/09/2010 16:19:08  _____________________________________________________________________  External Attachment:    Type:   Image     Comment:   External Document

## 2010-10-16 NOTE — Procedures (Signed)
Summary: device/saf  [facility]  Medications Added SPIRONOLACTONE 25 MG  TABS (SPIRONOLACTONE) as needed COZAAR 25 MG TABS (LOSARTAN POTASSIUM) one by mouth two times a day OMEPRAZOLE 20 MG TBEC (OMEPRAZOLE) as needed      Allergies Added:   Current Medications (verified): 1)  Spiriva Handihaler 18 Mcg  Caps (Tiotropium Bromide Monohydrate) .... Inhale Contents of 1 Capsule Once A Day 2)  Coreg 25 Mg  Tabs (Carvedilol) .... Two Times A Day 3)  Digoxin 0.25 Mg  Tabs (Digoxin) .... Take 1 Tablet By Mouth Once A Day 4)  Spironolactone 25 Mg  Tabs (Spironolactone) .... As Needed 5)  Bayer Low Strength 81 Mg  Tbec (Aspirin) .... Take 1 Tablet By Mouth Once A Day 6)  Duoneb 2.5-0.5 Mg/48ml  Soln (Albuterol-Ipratropium) .... 2-3 Times A Day 7)  Cozaar 25 Mg Tabs (Losartan Potassium) .... One By Mouth Two Times A Day 8)  Ventolin Hfa 108 (90 Base) Mcg/act  Aers (Albuterol Sulfate) .... As Needed 9)  Advair Diskus 500-50 Mcg/dose Aepb (Fluticasone-Salmeterol) .... Uad 10)  Citalopram Hydrobromide 10 Mg Tabs (Citalopram Hydrobromide) .Marland Kitchen.. 1qd 11)  Omeprazole 20 Mg Tbec (Omeprazole) .... As Needed 12)  Omega-3 Fish Oil 1000 Mg Caps (Omega-3 Fatty Acids) .... Take 1 Capsule By Mouth Three Times A Day 13)  Lipitor 10 Mg Tabs (Atorvastatin Calcium) .... Take One Tablet By Mouth Daily. 14)  Lorazepam 0.5 Mg Tabs (Lorazepam) .... One Tablet Daily As Needed 15)  O2 2.5 L Qhs .... Qhs  Allergies (verified): 1)  ! * No Ivp Dye Allergy 2)  ! * No Shellfish Allergy 3)  ! * No Latex Allergy   ICD Specifications Following MD:  Lewayne Bunting, MD     ICD Vendor:  [employer]     ICD Model Number:  [phone]     ICD Serial Number:  [id number] ICD DOI:  07/19/2009     ICD Implanting MD:  Lewayne Bunting, MD  Lead 1:    Location: RA     DOI: 11/06/2004     Model #: [id number]     Serial #: [id number]     Status: active Lead 2:    Location: RV     DOI: 11/06/2004     Model #: [id number]     Serial #: [id number]     Status: active  Indications::   VT  Explantation Comments: 07/19/2009 [employer] Vitality T125/114081 explanted  ICD Follow Up ICD Dependent:  No      Episodes Coumadin:  No  Brady Parameters Mode DDD     Lower Rate Limit:  50     Upper Rate Limit 120 PAV 200     Sensed AV Delay:  200  Tachy Zones VF:  200     VT:  176     Tech Comments:  See PaceArt

## 2010-10-22 LAB — POCT I-STAT, CHEM 8
Calcium, Ion: 1.11 mmol/L — ABNORMAL LOW (ref 1.12–1.32)
Glucose, Bld: 132 mg/dL — ABNORMAL HIGH (ref 70–99)
HCT: 46 % (ref 36.0–46.0)
Hemoglobin: 15.6 g/dL — ABNORMAL HIGH (ref 12.0–15.0)
Potassium: 4.2 mEq/L (ref 3.5–5.1)

## 2010-11-05 ENCOUNTER — Other Ambulatory Visit: Payer: Self-pay | Admitting: Internal Medicine

## 2010-11-08 LAB — CARDIAC PANEL(CRET KIN+CKTOT+MB+TROPI)
CK, MB: 1.2 ng/mL (ref 0.3–4.0)
Total CK: 32 U/L (ref 7–177)
Troponin I: 0.02 ng/mL (ref 0.00–0.06)

## 2010-11-08 LAB — CBC
Hemoglobin: 14.3 g/dL (ref 12.0–15.0)
MCHC: 33.7 g/dL (ref 30.0–36.0)
MCV: 96.7 fL (ref 78.0–100.0)
Platelets: 428 10*3/uL — ABNORMAL HIGH (ref 150–400)
RBC: 4.39 MIL/uL (ref 3.87–5.11)
RDW: 13.6 % (ref 11.5–15.5)
WBC: 15.7 10*3/uL — ABNORMAL HIGH (ref 4.0–10.5)

## 2010-11-08 LAB — DIGOXIN LEVEL: Digoxin Level: 1.3 ng/mL (ref 0.8–2.0)

## 2010-11-08 LAB — DIFFERENTIAL
Basophils Absolute: 0.1 10*3/uL (ref 0.0–0.1)
Eosinophils Relative: 2 % (ref 0–5)
Lymphocytes Relative: 28 % (ref 12–46)
Neutro Abs: 10.7 10*3/uL — ABNORMAL HIGH (ref 1.7–7.7)
Neutrophils Relative %: 55 % (ref 43–77)

## 2010-11-08 LAB — POCT CARDIAC MARKERS
Myoglobin, poc: 49.4 ng/mL (ref 12–200)
Troponin i, poc: <0.05 ng/mL (ref 0.00–0.09)
Troponin i, poc: <0.05 ng/mL (ref 0.00–0.09)

## 2010-11-08 LAB — BASIC METABOLIC PANEL
BUN: 10 mg/dL (ref 6–23)
Calcium: 9.5 mg/dL (ref 8.4–10.5)
Creatinine, Ser: 0.69 mg/dL (ref 0.4–1.2)
GFR calc non Af Amer: >60 mL/min (ref >60)
Glucose, Bld: 104 mg/dL — ABNORMAL HIGH (ref 70–99)

## 2010-11-08 LAB — TSH: TSH: 2.639 u[IU]/mL (ref 0.350–4.500)

## 2010-11-08 LAB — LIPID PANEL
Cholesterol: 207 mg/dL — ABNORMAL HIGH (ref 0–200)
HDL: 31 mg/dL — ABNORMAL LOW (ref >39)
Total CHOL/HDL Ratio: 6.7 RATIO

## 2010-11-09 LAB — BASIC METABOLIC PANEL
BUN: 8 mg/dL (ref 6–23)
Calcium: 9.3 mg/dL (ref 8.4–10.5)
Creatinine, Ser: 0.7 mg/dL (ref 0.4–1.2)
GFR calc Af Amer: >60 mL/min (ref >60)
GFR calc non Af Amer: >60 mL/min (ref >60)

## 2010-11-09 LAB — DIFFERENTIAL
Eosinophils Absolute: 0.7 10*3/uL (ref 0.0–0.7)
Lymphocytes Relative: 28 % (ref 12–46)
Lymphs Abs: 3.4 10*3/uL (ref 0.7–4.0)
Monocytes Relative: 11 % (ref 3–12)
Neutro Abs: 6.6 10*3/uL (ref 1.7–7.7)
Neutrophils Relative %: 55 % (ref 43–77)

## 2010-11-09 LAB — POCT CARDIAC MARKERS
CKMB, poc: <1 ng/mL — ABNORMAL LOW (ref 1.0–8.0)
Myoglobin, poc: 51 ng/mL (ref 12–200)
Troponin i, poc: <0.05 ng/mL (ref 0.00–0.09)

## 2010-11-09 LAB — CBC
Platelets: 417 10*3/uL — ABNORMAL HIGH (ref 150–400)
RBC: 4.61 MIL/uL (ref 3.87–5.11)
WBC: 12.1 10*3/uL — ABNORMAL HIGH (ref 4.0–10.5)

## 2010-11-09 LAB — CK TOTAL AND CKMB (NOT AT ARMC): Relative Index: INVALID (ref 0.0–2.5)

## 2010-11-09 LAB — TROPONIN I: Troponin I: 0.01 ng/mL (ref 0.00–0.06)

## 2010-11-21 IMAGING — CR DG CHEST 1V PORT
1 series · 1 of 1 positions shown · non-contrast
Comparison: 09/20/2007

CLINICAL DATA: Endotracheal tube placement

PORTABLE CHEST - 1 VIEW

[AP]
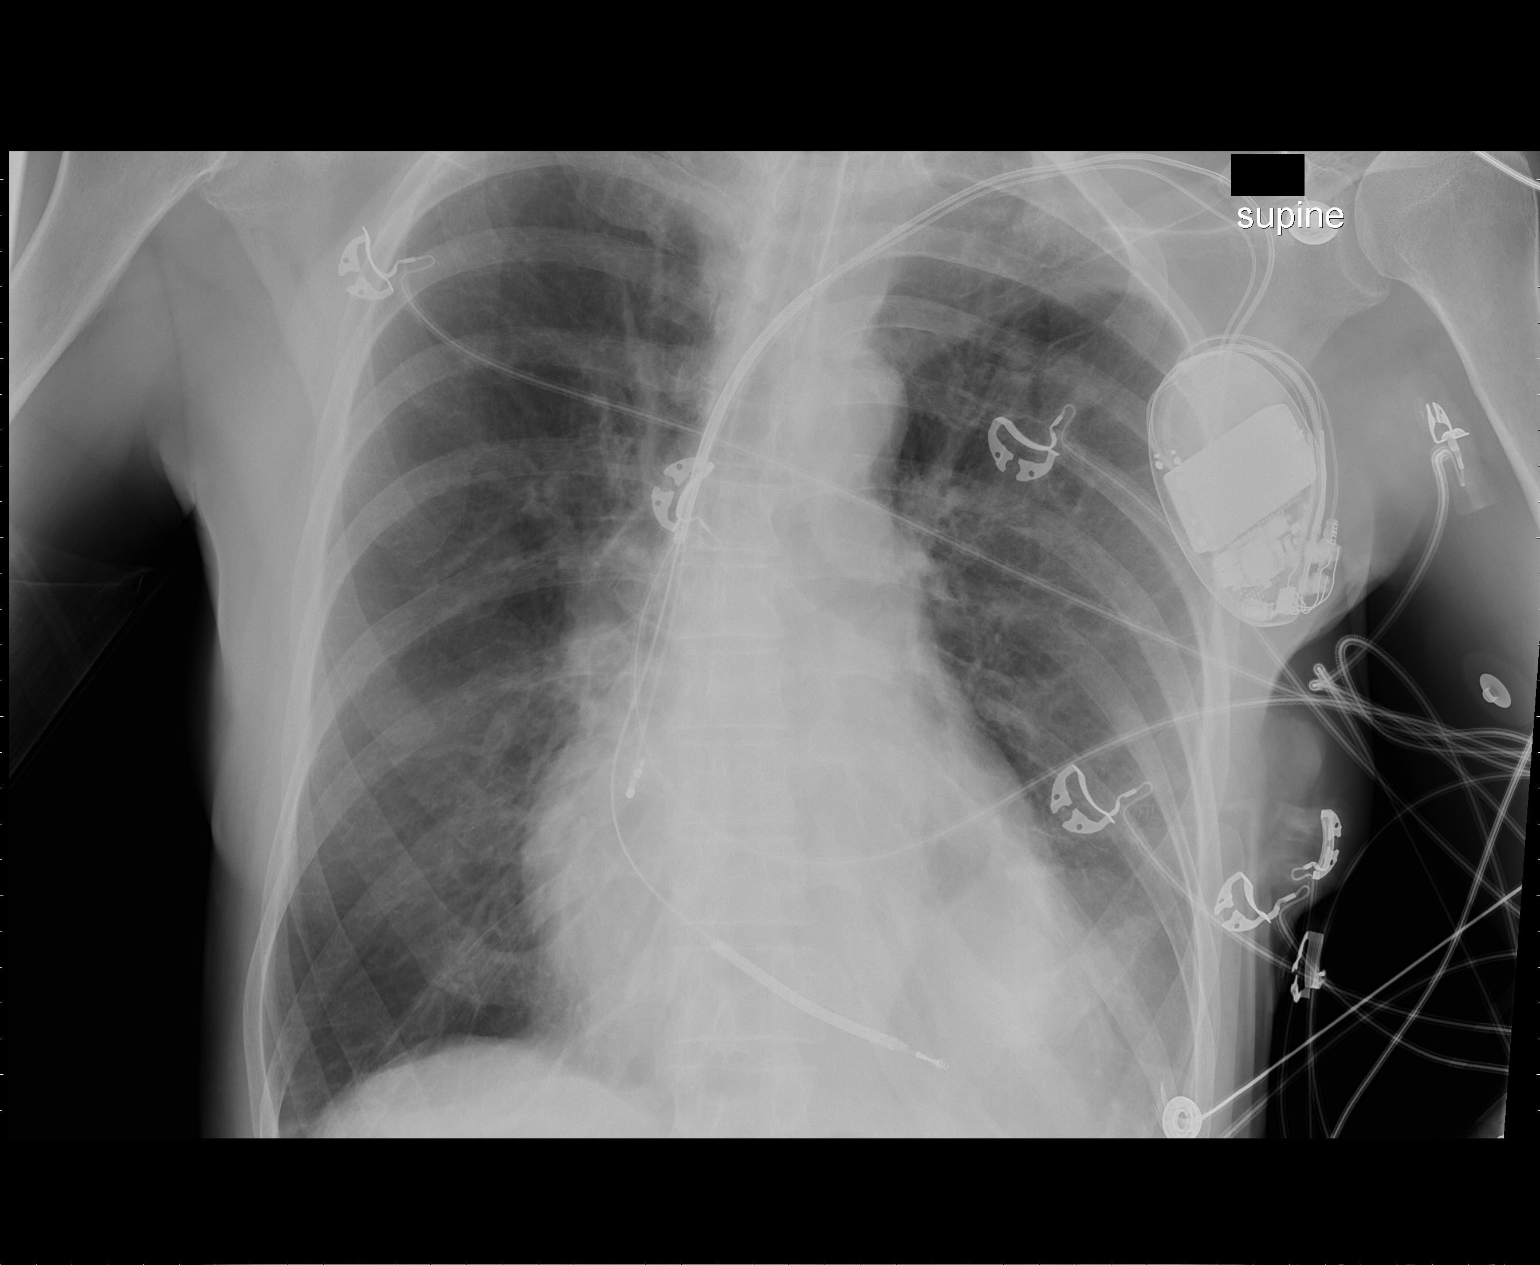

[1 of 1 positions shown; findings below may reference images not displayed]

FINDINGS: The endotracheal tube is in good position approximately 4
cm above the carina.  The pacer wires / AICD are stable.  The
cardiac silhouette, mediastinal and hilar contours are within
normal limits given the AP supine position of the patient.  There
are chronic lung changes but no acute pulmonary findings.  The bony
thorax is intact.  Eventration of the left hemidiaphragm is noted.
IMPRESSION: 1.  Endotracheal tube in good position at the mid tracheal level.
2.  No acute pulmonary findings.  Chronic lung changes.

## 2010-11-21 IMAGING — CR DG CHEST 1V PORT
1 series · 1 of 1 positions shown · non-contrast
Comparison: 07/19/2008 at 3337 hours.

CLINICAL DATA: Respiratory distress

PORTABLE CHEST - 1 VIEW

[AP]
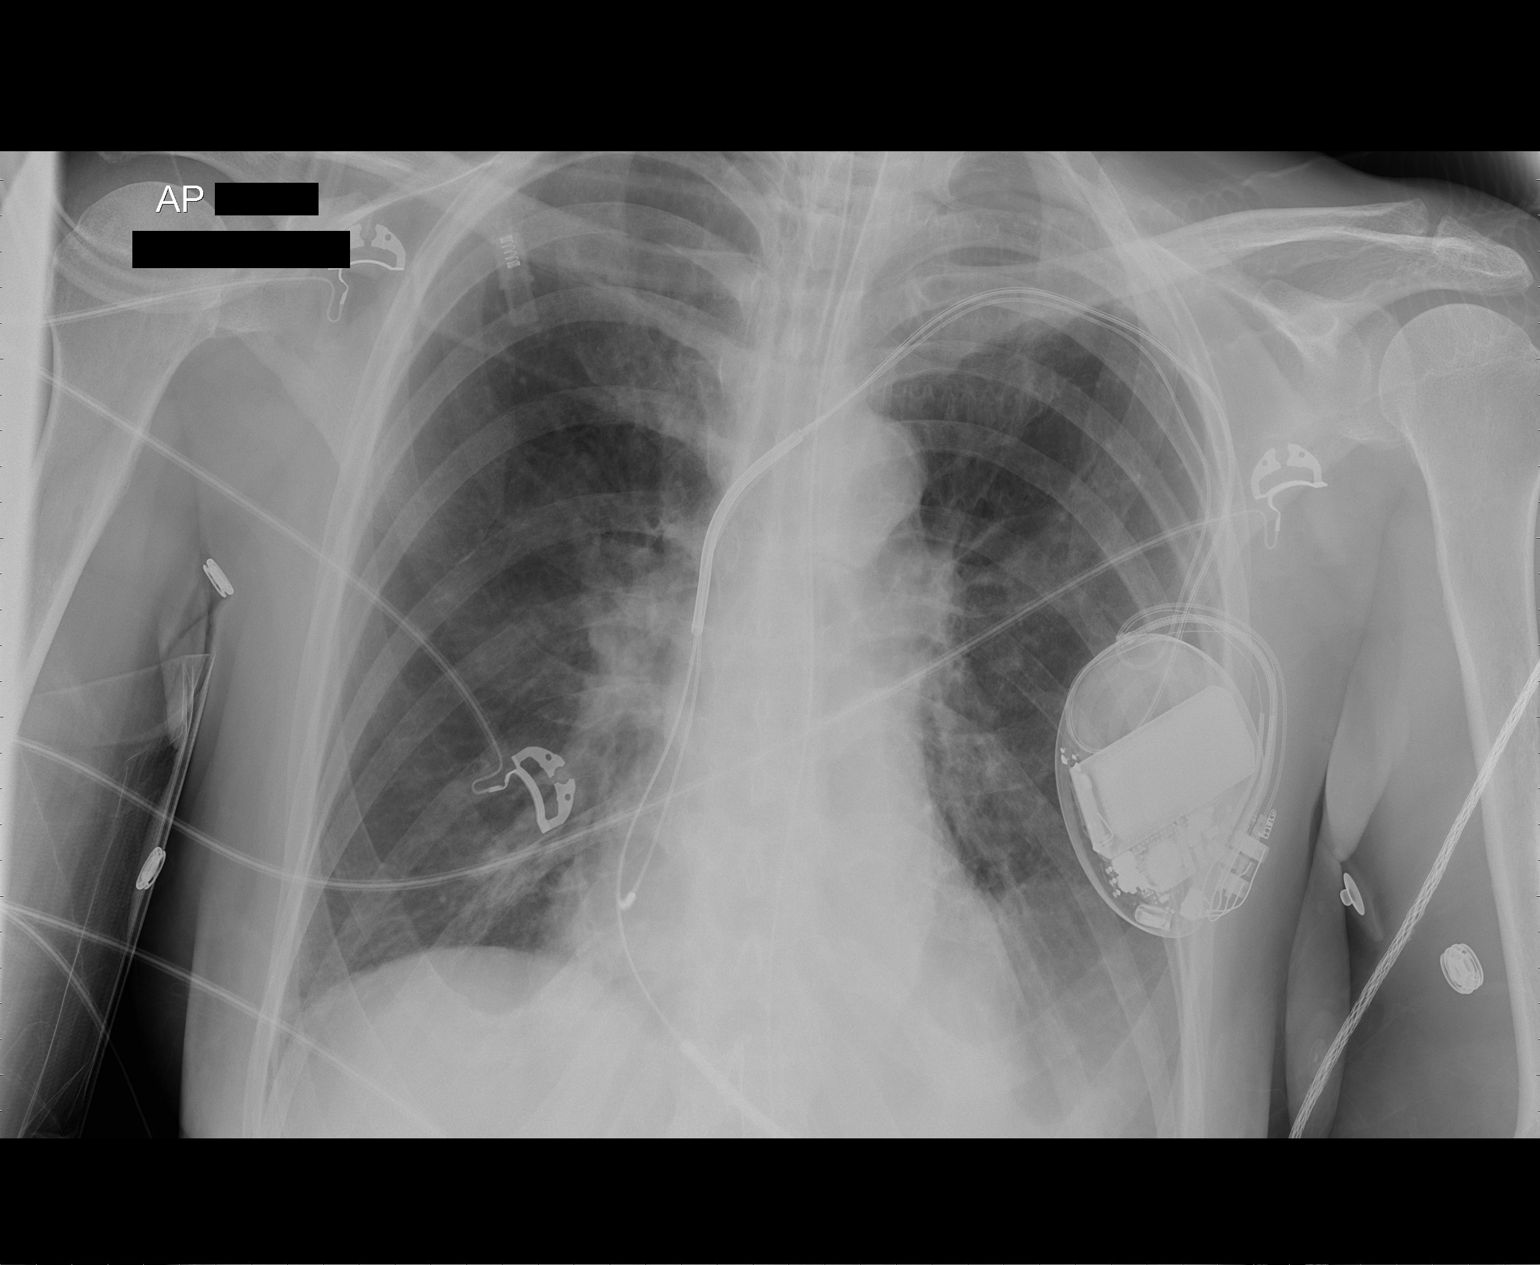

[1 of 1 positions shown; findings below may reference images not displayed]

FINDINGS: The endotracheal tube is stable.  There is a new NG tube
which is coiled back on itself in the stomach with the tip near the
GE junction. Persistent bibasilar atelectasis and possible small
left effusion.
IMPRESSION: Stable chest x-ray except for the addition of an NG tube.

## 2010-11-22 IMAGING — CR DG CHEST 1V PORT
2 series · 2 of 2 positions shown · non-contrast
Comparison: 1 day prior

CLINICAL DATA: Respiratory failure.  Aspiration.

PORTABLE CHEST - 1 VIEW

[AP (1 of 2)]
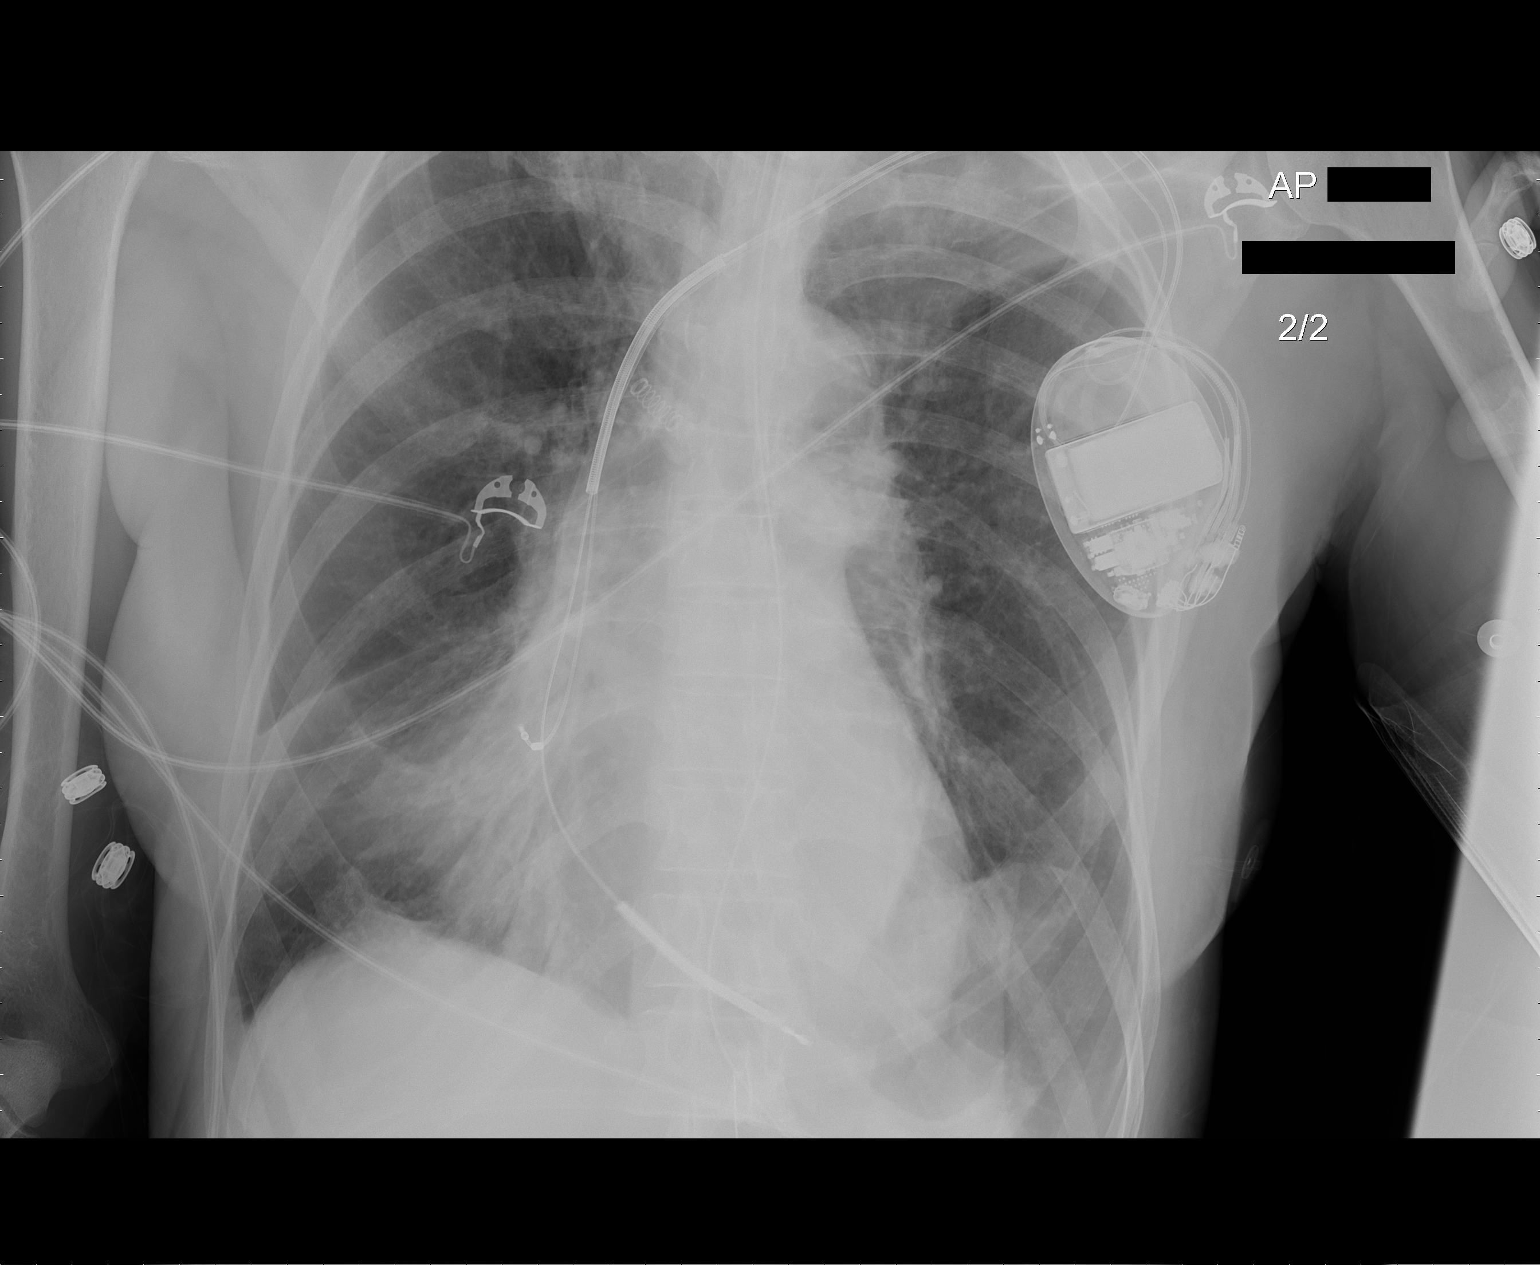

[AP (2 of 2)]
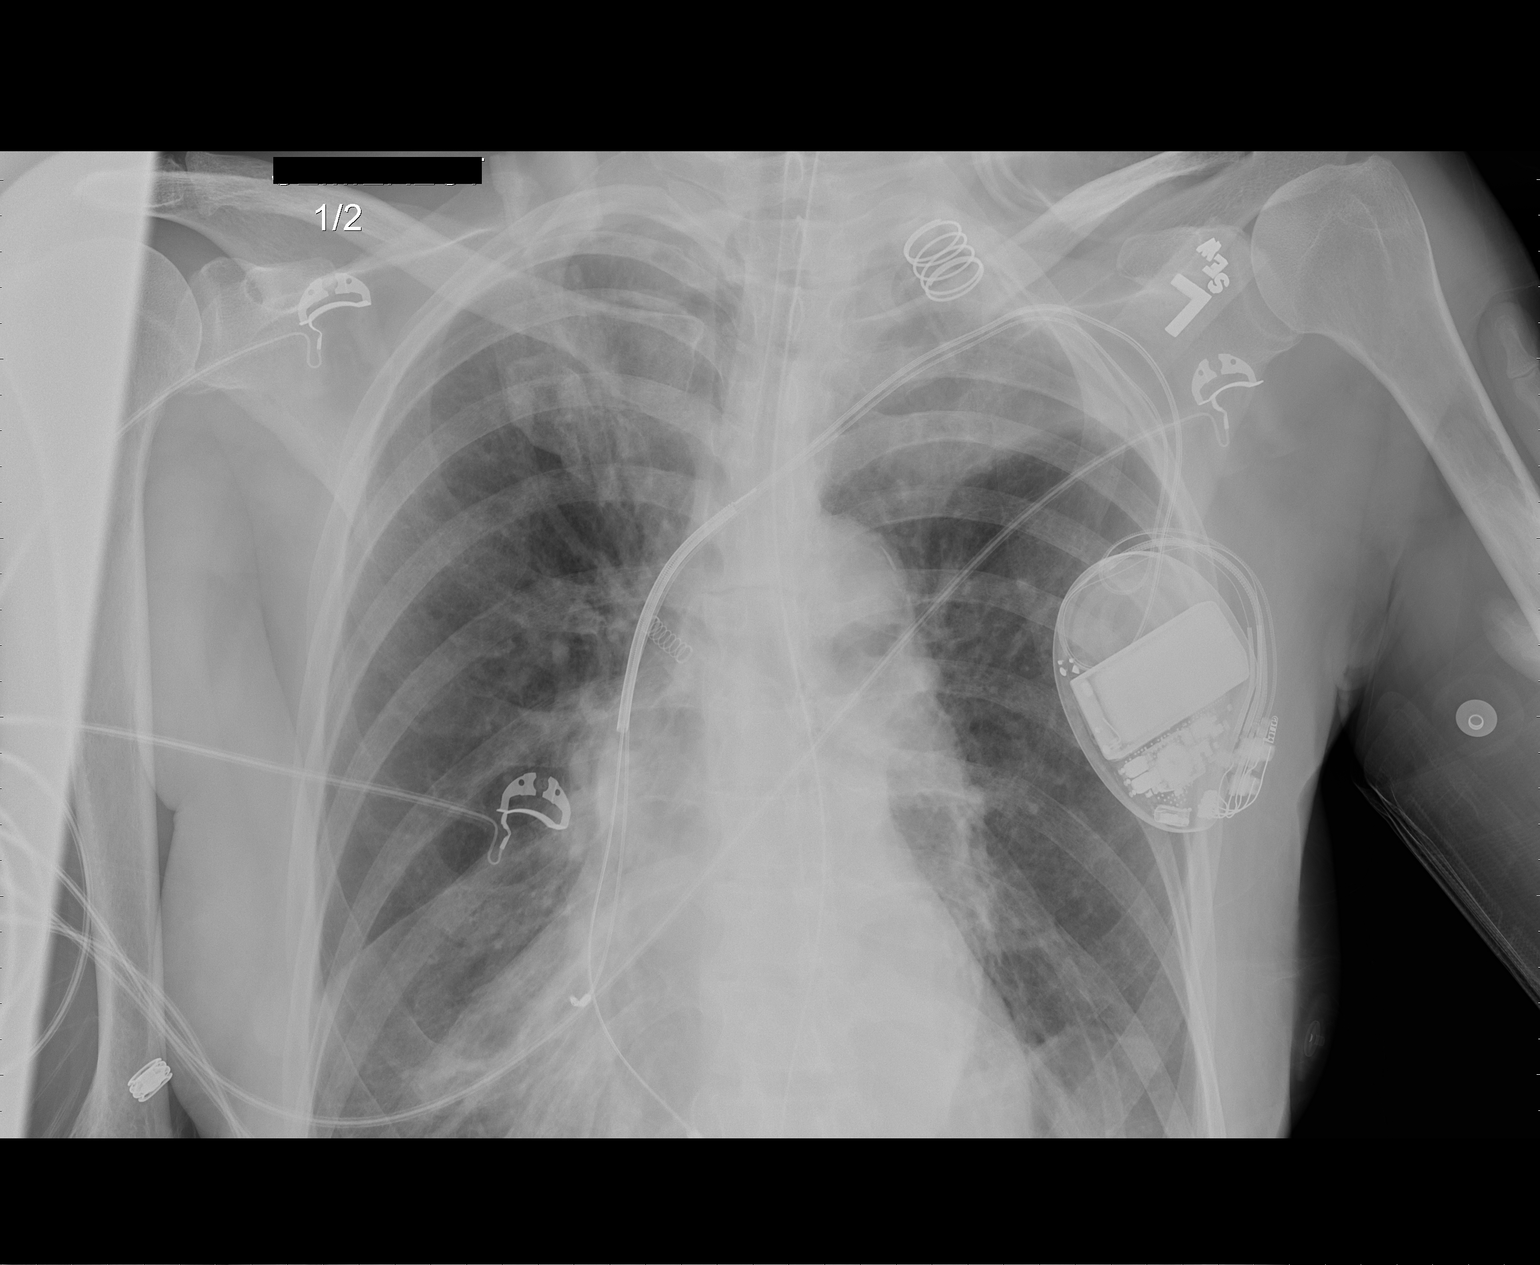

[2 of 2 positions shown; findings below may reference images not displayed]

FINDINGS: Nasogastric tube remains looped in the stomach with tip
directed cephalad.  Endotracheal tube unchanged.  Pacer / AICD
device leads right atrium right ventricle.  Hyperinflation.  Normal
heart size.  No pneumothorax.  Decreased to resolved left-sided
pleural effusion.  Improved left base aeration.  Increased right
lower lobe airspace disease.
IMPRESSION: 1.  Slight worsening in right base aeration suspicious for
atelectasis or early infection.
2.  Improved left base aeration.
3.  Nasogastric tube again with tip directed superiorly.

## 2010-11-23 IMAGING — CR DG CHEST 1V PORT
1 series · 1 of 1 positions shown · non-contrast
Comparison: 07/20/2008

CLINICAL DATA: Status post respiratory arrest

PORTABLE CHEST - 1 VIEW

[AP]
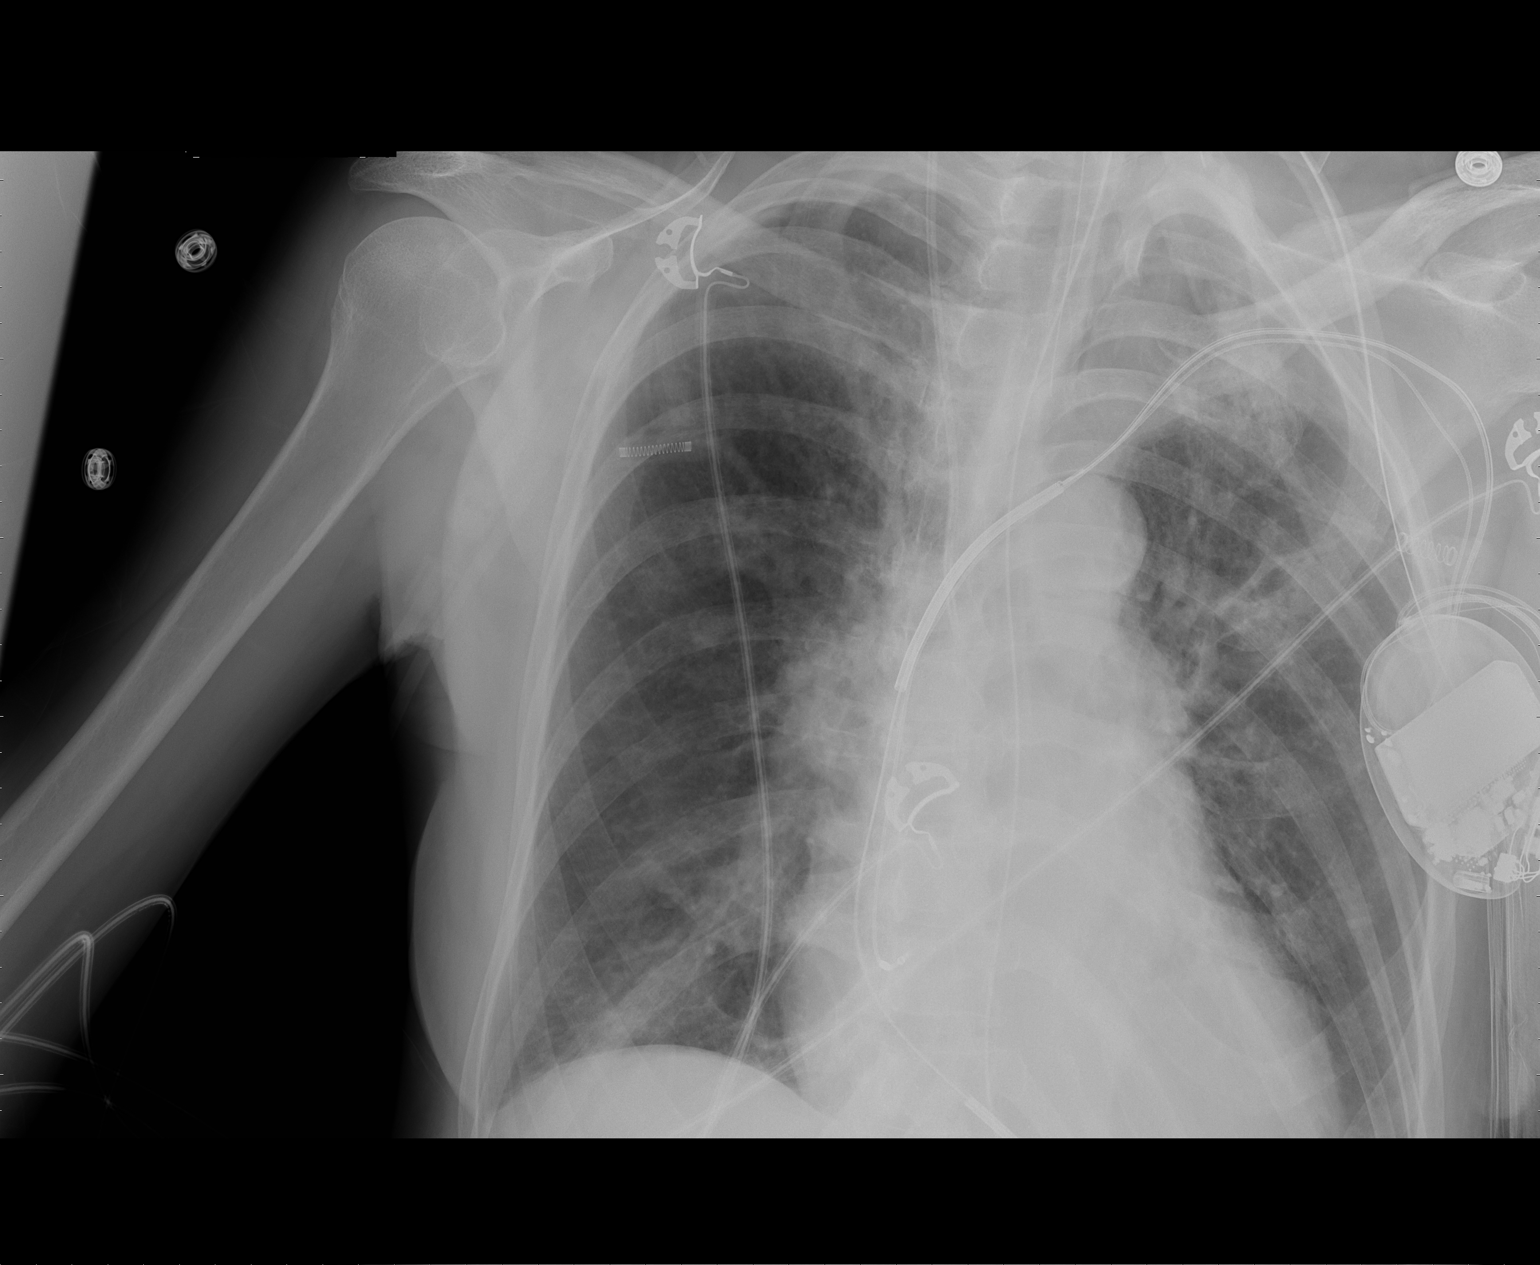

[1 of 1 positions shown; findings below may reference images not displayed]

FINDINGS: ET tube tip is stable above the carina.

There is a right-sided IJ catheter with tip in the projection the
SVC.

The nasogastric tube tip is in the stomach.

There is a left chest wall pacer device with leads in the right
atrial appendage and right ventricle.

The heart size is normal.

No pleural effusion noted.

There is mild interstitial edema/pulmonary venous congestion.
IMPRESSION: 1.  Stable support apparatus.
2.  Mild edema/pulmonary venous congestion.

## 2010-12-18 NOTE — Assessment & Plan Note (Signed)
[facility]                             PULMONARY OFFICE NOTE   EDELMIRA, GALLOGLY                        MRN:          [id number]  DATE:02/09/2007                            DOB:          December 02, 1940    PROBLEMS:  1. Chronic obstructive pulmonary disease.  2. Tobacco abuse.  3. Cardiomyopathy/coronary disease/EF 25%/ablation/pacemaker      defibrillator.  4. Orthostatic hypotension.   HISTORY:  In the past few days, she has been more congested and she  considered calling for a prednisone prescription. She still smokes.  Recent cardiology visit apparently went okay. She rarely produces any  phlegm. She is using her albuterol inhaler four times a day, especially  after exertion, and she continues Qvar and Spiriva. Advair caused mouth  irritation that she could not get around.   MEDICATIONS:  1. Spiriva.  2. Coreg 25 mg.  3. Digoxin 0.25 mg.  4. Spironolactone 25 mg.  5. Furosemide 40 mg.  6. Aspirin 81 mg.  7. Home nebulizer with DuoNeb.  8. Avapro 75 mg.  9. Qvar 80 2 puffs b.i.d.  10.Ventolin HFA rescue inhaler.   ALLERGIES:  No medication allergy.   OBJECTIVE:  Weight 127 pounds, blood pressure 96/60, pulse 68, room air  saturation 94%. Breath sounds are diminished, but not congested. She is  not laboring. I hear no rales, wheeze, or rhonchi. Heart sounds are  regular and I do not hear a murmur. I do not find adenopathy or edema.   IMPRESSION:  Chronic obstructive pulmonary disease recent exacerbation,  she apparently is getting under control. Our biggest concern is her  ongoing tobacco abuse with significant cardiac history as well.   PLAN:  1. Smoking cessation was emphasized with prolonged discussion again      today.  2. Try Symbicort 80/4.5 as a replacement for Qvar.  3. Schedule return 6 months, earlier p.r.n.     Clinton D. Maple Hudson, MD, Tonny Bollman, FACP  Electronically Signed    CDY/MedQ  DD: 02/28/2007  DT: 03/02/2007  Job #:  [id number]   cc:   Georgann Housekeeper, MD  Darlin Priestly, MD

## 2010-12-18 NOTE — Consult Note (Signed)
NAMEANNIKAH, LOVINS NO.:  [id number]   MEDICAL RECORD NO.:  [id number]          PATIENT TYPE:  OBV   LOCATION:  6523                         FACILITY:  [facility]   PHYSICIAN:  Doylene Canning. Ladona Ridgel, MD    DATE OF BIRTH:  15-Aug-1940   DATE OF CONSULTATION:  08/25/2007  DATE OF DISCHARGE:                                 CONSULTATION   INDICATION FOR CONSULTATION:  Evaluation of palpitations in the setting  of a nonischemic cardiomyopathy in a patient with prior defibrillator  and recent ICD therapy.   HISTORY OF PRESENT ILLNESS:  The patient is a 70 year old woman with a  longstanding nonischemic cardiomyopathy and EF of approximately 40%.  She has a history of VT and underwent ICD implantation in 2006.  This  was done at the [facility] in [facility].  The patient  who was initially from [place] has returned here, moving back to  [place] several months ago.  She was in her usual state of health but  presented back on August 19, 2007 with episodes of palpitations and was  subsequently admitted to the hospital for additional evaluation.  The  patient notes that she has had some increased anxiety recently.   PAST MEDICAL HISTORY:  1. Hypertension.  2. History of COPD.  3. History of longstanding LV dysfunction.   She does note that she recently had her dose of Carvedilol increased  from 12.5 to 25 mg twice daily, and with this her dyspnea has worsened  and she thinks her palpitations have worsened.   MEDICATIONS:  1. Digoxin 0.25 mg daily.  2. Coreg 25 twice daily.  3. Aldactone 25 daily.  4. Crestor 10 daily.  5. Cozaar 25 daily.  6. Ventolin inhaler 2 puffs daily.   SOCIAL HISTORY:  The patient is married and lives in [place].  She  has a history of tobacco abuse and now smokes approximately a half pack  of cigarettes per day.   FAMILY HISTORY:  Notable for mother who is still living but has dementia  and father who died of  complications of an ulcer.  She has one brother  with coronary disease.   REVIEW OF SYSTEMS:  Notable for a 10-pound weight loss in the last 6  months.  She denies syncope.  She has chronic dyspnea on exertion.  She  denies claudication.  She does have an occasional cough and  palpitations.  She has reflux symptoms, and she has a mild depression.  Otherwise, all systems were reviewed and found to be negative except as  noted above.   PHYSICAL EXAMINATION:  GENERAL:  She is a pleasant 70 year old woman in  no distress.  VITAL SIGNS:  The blood pressure was 100/60, the pulse was 67 and  regular, respirations were 16, temperature was 98.  Oxygen saturation  was 93% on room air.  HEENT:  Normocephalic and atraumatic.  Pupils equal and round.  Oropharynx was moist.  Sclerae were anicteric.  NECK:  No jugular venous distention.  There was no thyromegaly.  Trachea  was midline.  Carotids were 2+ and  symmetric.  CARDIOVASCULAR EXAM:  Irregular rate and rhythm with normal S1-S2.  I  did not appreciate any murmurs, rubs or gallops today otherwise.  The  PMI was laterally displaced but not particularly enlarged.  LUNGS:  Clear bilaterally to auscultation.  No wheezes, rales or rhonchi  were present.  There was no increased work of breathing.  should be  noted.  ABDOMEN:  Soft, nontender, nondistended.  There was no organomegaly.  The bowel sounds were present.  There was no rebound or guarding.  EXTREMITIES:  No cyanosis, clubbing or edema.  Pulses were 2+ and  symmetric.  NEUROLOGIC:  Alert and oriented x3 with cranial nerves intact.  The  strength was 5/5 and symmetric.   ELECTROCARDIOGRAM:  Her EKG demonstrates sinus rhythm with frequent PVCs  and nonsustained VT.   IMPRESSION:  1. Nonischemic cardiomyopathy.  2. Nonsustained ventricular tachycardia.  3. Status post ICD insertion.  4. Chronic obstructive pulmonary disease with worsening dyspnea with      up titration of her  carvedilol.   DISCUSSION:  With all of the above and the patient's symptomatic  nonsustained VT, I recommend that we start her on mexiletine.  It may  well be that this drug will not help with the palpitations, but because  of its relatively benign side effect profile, I think it would be a  reasonable thing to start.  Also, the patient thinks that she has had  more shortness of breath with up titration of her carvedilol, and to  this end, I would consider down titrating it, as she may have some  reactive airway disease causing some bronchospasm on the Carvedilol.  Will plan to follow the patient with you.  Hopefully she can be  discharged home soon.      Doylene Canning. Ladona Ridgel, MD  Electronically Signed     GWT/MEDQ  D:  08/26/2007  T:  08/26/2007  Job:  [id number]   cc:   Georgann Housekeeper, MD

## 2010-12-18 NOTE — Discharge Summary (Signed)
**[name]** [name], COPPA               ACCOUNT NO.:  [id number]   MEDICAL RECORD NO.:  [id number]          PATIENT TYPE:  OBV   LOCATION:  6523                         FACILITY:  [facility]   PHYSICIAN:  Cristy Hilts. Jacinto Halim, MD       DATE OF BIRTH:  1940-10-25   DATE OF ADMISSION:  08/24/2007  DATE OF DISCHARGE:  08/25/2006                               DISCHARGE SUMMARY   HISTORY OF PRESENT ILLNESS:  [name] is a 70 year old white  female with a history of nonischemic cardiomyopathy with an EF of 40%.  She had an ICD placed in 2006.  She apparently presented to [facility] with palpitations.  She was last seen by Dr. Jenne Campus just  approximately 1 week ago.  She had no defibrillation firing.  However,  she did have one episode of atrial fibrillation.  VT  terminated by ATP.  Her Coreg was increased to 25 mg b.i.d. Cozaar decreased to 25 mg every  day.  She had apparently been consumed since her last visit about  getting shock.  She began noticing episodes of palpitations, fullness in  her neck, and also sensation in her abdomen.  Episodes were intermittent  and became more frequent, thus, she came to the hospital.  She was seen  by Dr. Allyson Sabal and admitted.  She had her ICD interrogated.  She had no  significant arrhythmias.  In the EP, consult was called.  She was noted  to be in sinus rhythm with frequent PVCs with interrogation.  She was  seen by Dr. Ladona Ridgel.  He thought she was clearly having symptoms of SVT  with a COPD.  So it was decided that she would be tried on Mexitil.  She  was watched for 24 hours, and she will be followed up as an outpatient  with Dr. Ladona Ridgel for dose titration.  He also recommended decreasing her  Coreg because she thought her dyspnea had gotten worse with the Coreg.  She was seen by Dr. Jacinto Halim on August 26, 2007, considered stable to be  discharged home.   DISCHARGE MEDICATIONS:  1. Mexitil 300 mg in the morning and in the p.m.  2. Crestor 10 mg every  day.  3. Cozaar 25 mg a day.  4. Coreg 25 mg in the morning 12.5 in the p.m.  5. Lanoxin 0.25 mg a day.  6. Spirolactone 25 mg a day.  7. Aspirin 81 mg two at bedtime.  8. Omega 3 fish oil capsules two twice per day.  9. Spiriva MDI daily.  10.Ventolin MDI four times per day.   FOLLOWUP:  She will follow up with Dr. Ladona Ridgel on February 12 at 10:45.  She will follow up with Dr. Jenne Campus in 2 weeks.  Our office will call.  She was also recommended follow-up with Dr. Donette Larry in 1-2 weeks.   DISCHARGE INSTRUCTIONS:  She has no restrictions as far as her activity,  and she should be on a low sodium heart healthy diet which she is aware.  Her chest x-ray showed the scarring in the left lung which is  old and  stable and some cardiomegaly, no active disease.   LABORATORY DATA:  Sodium 141, potassium 4.2, BUN 7, creatinine 0.72,  chloride 105, CO2 32.  Hemoglobin 16, hematocrit 48, WBC 13.  Her  admission CBC showed a WBC of 15.4.  Review of her record showed  November 1 her WBC was 13.4.  cardiac panel was negative.  Her amylase  was 63, lipase 21.  Urine was without any bacteria.   DISCHARGE DIAGNOSIS:  1. Palpitations with an supraventricular tachycardia and more recently      in the office.  She had a ventricular tachycardia that was ATP      paced.  2. Dilated cardiomyopathy.  3. Automatic implantable cardioverter defibrillator.  4. Chronic obstructive pulmonary disease.  5. Ejection fraction 40% by echo April 2007.  6. History of paroxysmal supraventricular tachycardia status post      radiofrequency ablation.  7. History of RV clots in the past treated with Coumadin.  8. Elevated hemoglobin, hematocrit and white blood cells.      [name], N.P.      Cristy Hilts. Jacinto Halim, MD  Electronically Signed    BB/MEDQ  D:  08/26/2007  T:  08/26/2007  Job:  [id number]   cc:   Doylene Canning. Ladona Ridgel, MD  Georgann Housekeeper, MD

## 2010-12-18 NOTE — Procedures (Signed)
EEG NUMBER:  [id number].   HISTORY:  This is a 70 year old patient who has had a cardiac arrest on  July 19, 2008.  The patient has a history of ischemic cardiomyopathy  and COPD.  The patient is evaluated for possible anoxic brain injury.   MEDICATIONS:  Atropine Versed, Apresoline, Protonix, insulin, Rocephin,  Ativan, dopamine, and Lantus.   EEG CLASSIFICATION:  Essentially normal sleep recording.   EEG DESCRIPTION:  The background rhythm of this recording consists of a  5-7 Hz background slowing that appears to be of normal amplitude and is  seen throughout the recording.  As the record progresses, rudimentary  sleep spindles, K complexes, vertex sharp-wave activity is seen and may  be consistent with a normal sleep pattern.  The entire recording appears  to be consistent with an ongoing stage II sleep.  The patient does not  appear to be fully alerted at any time during the recording.  Photic  stimulation is performed resulting in a minimal driving response seen  bilaterally.  At no time during the recording, there appear to be  evidence of spike, spike wave discharges, or evidence of focal slowing.  EKG monitor shows occasional premature ventricular complexes with a  heart rate of 90.   IMPRESSION:  This study appears to be consistent with a normal sleep  pattern.  Clinical correlation is required, however.  Of note, it is  that the background activities are of normal amplitude suggesting that a  severe anoxic neuronal injury has not occurred.  This study would  suggest that there may be a reasonable chance of full neurologic  recovery, but again clinical correlation is required.      Marlan Palau, M.D.  Electronically Signed     EAV:WUJW  D:  07/20/2008 15:14:16  T:  07/21/2008 03:20:11  Job #:  [id number]

## 2010-12-18 NOTE — Consult Note (Signed)
NAME:  [name], [name] NO.:  [id number]   MEDICAL RECORD NO.:  [id number]          PATIENT TYPE:   LOCATION:                                 FACILITY:   PHYSICIAN:  Melvyn Novas, M.D.       DATE OF BIRTH:   DATE OF CONSULTATION:  07/19/2008  DATE OF DISCHARGE:                                 CONSULTATION   CHIEF COMPLAINT:  Anoxic brain injury.   HISTORY OF PRESENT ILLNESS:  This is a 70 year old white female who  apparently had difficulty speaking and breathing on July 18, 2008.  The patient called her husband over to her and asked him to dial 911.  Unfortunately, the patient's husband is blind, was having difficulty  calling 911, so the patient picked up the phone and dialed it herself.  EMS arrived on scene to find patient apneicand pulseless and a rhythm  that was PEA.  The patient was then immediately intubated and brought to [facility] ED.  Time down is unknown at this time.  While the patient in the [facility]  ED, she was placed on a ventilator and resuscitated with ACLS protocol.  Resuscitation time took approximately 12-14 minutes.  It was complicated  once with a transient episode of hypertension.  At time of consultation, the patient now is intubated.  She was started  on Versed and fentanyl; however, this was turned off at 8 a.m. and exam  was done at 9:50 a.m.  The patient is now 11:05 hours postcardiac arrest, unknown time down,  this patient is intubated and nonconvulsive, much of this history was  taken from prior notes within the chart.   PAST MEDICAL HISTORY:  1. COPD.  2. CHF.  3. Nonischemic cardiomyopathy with ejection fraction of 40.  Internal      pacer.   PAST SURGICAL HISTORY:  1. Splenectomy.  2. Cholecystectomy.  3. Esophageal surgery.   MEDICATIONS:  The patient takes on daily basis omeprazole, Advair,  Coreg, Cozaar, Digoxin, Aldactone, aspirin, and Spiriva.   ALLERGIES:  No known allergies at this time.   SOCIAL HISTORY:  The patient lives alone.  She does smoke approximately  half-a-pack per day.   FAMILY HISTORY:  Unable to be obtained at this time.   REVIEW OF SYSTEMS:  All 12 systems were reviewed, and chart negative  except for what is above.   PHYSICAL EXAMINATION:  VITAL SIGNS:  Blood pressure is 139/81, pulse is  90, respiratory on ventilation is 14.  MENTAL STATUS:  The patient as stated is intubated in one hour post  fentanyl and Versed being ceased.  NEUROLOGY:  Cranial nerves on exam at this time, pupils are equal,  round, reactive and accommodating to light .  Extraocular muscles are  intact.  When eyelids are opened, the patient has deviated gaze upward;  however, it is in time it does go back to midline.  The patient does not  blink to threat; however, she has doll's eye, positive corneal reflex.  Negative sternutatory, positive gag.  Coordination, finger-to-nose, heel-  to-shin, fine motor movement are unobtainable,  gait unobtainable.  MOTOR:  The patient has intermittent flexion, extension of her fingers  that are spontaneous.  Otherwise, the patient is laying still in the  supine position.  The patient does pull from painful stimuli, bilateral  lower extremity and extends her elbows, flexes her wrists to painful  stimuli, bilateral upper extremities.  The patient has increased tone  throughout and noted spontaneous grasp reflex when object is placed in  her hand.  However, when asked to let go the patient intermittently will  let go, is unknown whether this is just the intermittent grasping motion  that she is doing or is she actually respond.  Deep tendon reflexes are 2+ throughout the upper extremities, 3+  bilateral patella, 2+ Achilles, and bilateral upgoing toes.  Grip was  unattainable.  PULMONARY:  Clear to auscultation.  NECK:  Negative bruit and supple.  SENSATION :  As above to painful stimuli.   LABORATORY DATA:  Digoxin level was 0.7, sodium 143,  potassium 3.1,  chloride 109, CO2 of 26, BUN 15, creatinine 0.95, glucose 80, white cell  count is 21.8, platelets 357, hemoglobin and hematocrit 14.7, 42.6.  Imaging none at this time.   ASSESSMENT:  A 70 year old female with anoxic brain injury, status post  PEA, unknown down time with resuscitation time 12-14 minutes.  At this  time, her cranial nerves are intact.  As this exam is only approximately  11.5 hours status post down time.  We will need to reexamine the patient  at the 24-hour mark.   RECOMMENDATIONS:  1. At this time, CT in the a.m. of head without contrast.  2. EEG in the a.m. and at this time making note that MRI is not      possible secondary to internal defibrillator.   Thank you very much for this consult.  We will continue to monitor the  patient while she is inhouse.     ______________________________  Felicie Morn, PA-C      Melvyn Novas, M.D.  Electronically Signed    DS/MEDQ  D:  07/19/2008  T:  07/20/2008  Job:  [id number]   cc:   Melvyn Novas, M.D.

## 2010-12-18 NOTE — Discharge Summary (Signed)
NAMETANAIRI, CYPERT NO.:  [id number]   MEDICAL RECORD NO.:  [id number]          PATIENT TYPE:  INP   LOCATION:  5524                         FACILITY:  [facility]   PHYSICIAN:  Georgann Housekeeper, MD      DATE OF BIRTH:  29-Feb-1941   DATE OF ADMISSION:  09/21/2007  DATE OF DISCHARGE:  09/23/2007                               DISCHARGE SUMMARY   DISCHARGE DIAGNOSES:  1. Chronic obstructive pulmonary disease exacerbation.  2. History of congestive heart failure, systolic dysfunction.  3. Depression.   DISCHARGE MEDICATIONS:  1. Mexiletine 150 mg two tablets daily.  2. Crestor 10 mg daily.  3. Cozaar 25 mg daily.  4. Coreg 25 mg in the morning and 12.5 in the evening.  5. Lanoxin 0.25 mg daily.  6. Aldactone 25 mg daily.  7. Aspirin 81 mg two tablets daily.  8. Advair 250/50 one inhalation twice daily.  9. Spiriva one inhalation daily.  10.Atrovent/Albuterol nebulizer 4-6 times daily as needed.  11.Oxygen 2 liters nasal cannula.  12.Prednisone taper 60 mg down to 10 mg over two weeks.  13.Mucinex 600 mg twice daily.  14.Celexa 10 mg daily.  15.Ativan 0.5 mg three times daily as needed.   LABORATORY DATA:  White count 16.1, platelets 407, sodium 125, potassium  4.2, creatinine 0.6.  Cardiac markers times two negative.  Urine culture  negative.  Digoxin level 2.0.   Chest x-ray:  No infiltrates or CHF.   HOSPITAL COURSE:  A 70 year old with history of severe COPD, CHF and  nonsustained VT status post ICD insertion.  She came in with a COPD  exacerbation.  Chest x-ray did not show any evidence of pneumonia.  She  was started on IV Solu-Medrol and nebulizers with oxygen and Mucinex.  Patient started improving and her breathing started getting better, but  she still had wheezing bilaterally and required oxygen with two liters  nasal cannula.   She was also started on Advair, will continue that at home.  Patient  will switch to by mouth prednisone and taper slowly.   As far as her  anxiety and depression, she was started on Celexa 10 mg daily, will  titrate as needed.  Have  patient continue Lorazepam as needed.  As far as CHF with nonischemia  cardiomyopathy, continue with the current cardiac medication.  Her blood  pressure remained stable in the hospital.  Cardiac markers were  negative.  She remained normal sinus rhythm.  I will see her back in ten  days.  Home Health will be arranged.      Georgann Housekeeper, MD  Electronically Signed     KH/MEDQ  D:  09/23/2007  T:  09/23/2007  Job:  [id number]

## 2010-12-18 NOTE — Assessment & Plan Note (Signed)
[facility]                         ELECTROPHYSIOLOGY OFFICE NOTE   ADELLYN, CAPEK                        MRN:          [id number]  DATE:12/04/2007                            DOB:          1940/09/14    [name] returns today for followup.  She is a pleasant elderly woman  with an ischemic cardiomyopathy and ventricular tachycardia and multiple  other medical problems including dyslipidemia, coronary disease and  COPD.  The patient has had no intercurrent IC therapies since we last  saw her.  She was last in the office in February and has done well since  then.   MEDICATIONS:  1. Include Advair 200 mg 2 puffs daily.  2. Crestor 10 mg daily.  3. Lanoxin 0.25 mg daily.  4. Mexiletine 150 mg twice daily.  5. Coreg 25 mg half tablet daily.  6. Cozaar 25 mg daily.  7. Aspirin 81 mg daily.  8. Aldactone 25 mg daily.  9. Spiriva.   PHYSICAL EXAMINATION:  GENERAL:  She is a pleasant elderly woman in no  acute distress.  VITAL SIGNS:  Blood pressure was 116/69, the pulse 72 and regular, the  respirations were 18.  The weight was 120 pounds.  NECK:  Revealed no jugular venous distention.  There is no thyromegaly.  Carotids are 2+ and symmetric.  I did not appreciate any bruits.  CARDIOVASCULAR:  The cardiac exam showed a regular rate and rhythm.  Normal S1-S2.  There is a soft systolic murmur at the right upper  sternal border.  EXTREMITIES:  Demonstrated trace peripheral edema bilaterally.  No  cyanosis or clubbing present.   Interrogation of her defibrillator demonstrates a Guidant Vitality and R  waves are 4 and 16 respectively.  The impedance 49 and the A 738 and the  V threshold 0.4, 0.5 in the A and V.  Battery voltage was 2.61 volts  (mol).   IMPRESSION:  1. Ischemic cardiomyopathy.  2. Ventricular tachycardia.  3. Status post ICD (implantable cardioverter defibrillator) insertion.  4. COPD (chronic obstructive pulmonary disease).  5.  Ongoing tobacco abuse.   DISCUSSION:  First I have asked that Ms. Coolman stop smoking.  Secondly I  have informed her that her defibrillator is working normally.  Thirdly I  have asked that she continue a low-sodium  diet and to keep doing as much exercise as possible.  I will plan to see  the patient back in the office in several months.     Doylene Canning. Ladona Ridgel, MD  Electronically Signed    GWT/MedQ  DD: 12/04/2007  DT: 12/04/2007  Job #: [id number]

## 2010-12-18 NOTE — Assessment & Plan Note (Signed)
[facility]                         ELECTROPHYSIOLOGY OFFICE NOTE   CASS, EDINGER                        MRN:          [id number]  DATE:09/15/2008                            DOB:          17-Sep-1940    Ms. Blancett is seen at her own recognizance today because she is having  difficulties with tremor and balance since the resumption of mexiletine.  She had had difficulties with initiation of mexiletine in January 2009,  started as it was for PVCs and nonsustained ventricular tachycardia.  After some number of months taking 600 mg a day, she finally was able to  accommodate to it.  When she was admitted to the hospital in December,  her mexiletine was stopped and when she was discharged it was resumed.  Since that time, she has had problems again with tremulousness,  nervousness, and impaired balance.  She is almost unable to hold the  kitchen knife.  She has fallen a number of times, and this was  reminiscent of what happened with mexiletine initiation originally.   As best as I can tell, it was initiated for frequent ventricular ectopy  and nonsustained ventricular tachycardia.  Reviewing of the ICD data  demonstrates that she has had a persistence of about 1/6th of her beats  being PVCs, and this is similar from May 2009 all the way back to the  original implantation of her device.  Her ventricular arrhythmic history  is further notable for an ablation procedure undertaken at [facility] in the  late 1990s for ventricular tachycardia.  It is not clear to me what  those morphologies were and what the current morphologies are of her  ventricular ectopy.   Her medications currently include:  1. Mexiletine 200 three times a day.  2. Aldactone.  3. Cozaar 25.  4. Coreg 25 b.i.d.  5. Aspirin.  6. Lanoxin.   PHYSICAL EXAMINATION:  GENERAL:  She was mildly tremulous.  She had a  raccoon eye.  VITAL SIGNS:  Her heart rate was 68.  Her blood pressure was  not  recorded.  LUNGS:  Clear.  HEART:  Heart sounds were regular without ectopy.  ABDOMEN:  Soft.  EXTREMITIES:  No edema.   Interrogation of her ICD was remarkable for what was noted above.   IMPRESSION:  1. Tremulousness and poor balance and difficulty holding things      attributed to mexiletine and its resumption.  2. Nonischemic cardiomyopathy, question cause.  3. Longstanding history of ventricular tachycardia, ventricular      ectopy:      a.     Status post prior ablation attempt at [facility]       [place].      b.     Question contributing to the cardiomyopathy.      c.     Without significant response quantitatively to the       initiation of mexiletine.  4. Ongoing cigarette abuse.   Ms. Toda' mexiletine is clearly problematic.  It is not clear that it  has had a measurable impact on her ventricular ectopy,  and I have told  her to discontinue it.  I have asked her to let us know by the beginning  of next week whether she is feeling better as with a short half-life I  would anticipate that we should see some beginning to resolution of her  symptoms.  She noted that when mexiletine was started originally similar  problems had ensued.   I have asked that she follow up with Dr. Ladona Ridgel in about 3-4 weeks'  time.  At that time, he can quantitate the PVC burden and make a  decision as to whether it is likely contributing to her cardiomyopathy,  and consideration could be given to possible repeat ablation.  I  mentioned that to the patient today.  She will look to see her if she  has in her records the report of her ablation procedure at [facility].   She has been advised to continue her efforts to stop smoking.   She will follow up as noted.     Duke Salvia, MD, [facility]  Electronically Signed    SCK/MedQ  DD: 09/15/2008  DT: 09/15/2008  Job #: [id number]

## 2010-12-18 NOTE — Assessment & Plan Note (Signed)
[facility]                         ELECTROPHYSIOLOGY OFFICE NOTE   LEVINA, BOYACK                        MRN:          [id number]  DATE:09/17/2007                            DOB:          18-Sep-1940    HISTORY OF PRESENT ILLNESS:  [name] returns today for follow-up.  She  is a very pleasant woman with a history of nonischemic cardiomyopathy,  ventricular tachycardia and a familial cardiomyopathy.  She was  hospitalized back in January with VT which was symptomatic and at that  time we recommend mexiletine 300 mg twice daily.  She initially felt  some problems with tremulousness with the mexiletine, but this has  improved.  She has chronic dyspnea with exertion presently class II,  otherwise, no specific complaints today except for being somewhat  anxious secondary to her care requirements of her blind husband and her  very elderly mother.   MEDICATIONS:  1. Spiriva daily.  2. Aspirin 81 a day.  3. Mexiletine 150 mg daily.  4. Cozaar 25 daily.  5. Coreg 25 in the a.m. and 1/2 tablet in the p.m.   PHYSICAL EXAMINATION:  GENERAL:  She is a pleasant woman in no acute  distress.  VITAL SIGNS:  Blood pressure was 120/78, pulse was 81, respirations were  18, weight was 120 pounds.  NECK:  No jugular distention.  There is no thyromegaly.  LUNGS:  Clear bilaterally auscultation.  No wheezes, rales or rhonchi  are present.  CARDIAC:  Regular rate and rhythm with normal S1-S2 . There is  occasional skip beat noted.  EXTREMITIES:  Demonstrated no edema.   Review of her pacemaker defibrillator interrogation done in Dr.  Mikey Bussing office demonstrated no intercurrent IC therapies and overall  normal ICD function.   IMPRESSION:  1. Nonsustained VT  2. Status post ICD insertion.  3. Nonischemic cardiomyopathy.   DISCUSSION:  Overall, Ms. Haymond is stable.  Her defibrillator is working  normally.  She has had no recurrent VT on mexiletine.  I have  recommend  she continue on her current dosage and I will plan to see her back in  the office in several months.    Doylene Canning. Ladona Ridgel, MD  Electronically Signed   GWT/MedQ  DD: 09/17/2007  DT: 09/18/2007  Job #: [id number]   cc:   Darlin Priestly, MD

## 2010-12-18 NOTE — Assessment & Plan Note (Signed)
 HEALTHCARE                         ELECTROPHYSIOLOGY OFFICE NOTE   KWYNN, SCHLOTTER                        MRN:          [id number]  DATE:06/13/2008                            DOB:          04-01-1941    HISTORY OF PRESENT ILLNESS:  Ms. Marlett returns today for followup visit.  She is a very pleasant woman with a history of ischemic cardiomyopathy  and ventricular tachycardia, congestive heart failure, who returns today  for followup.  She also has dyslipidemia and ongoing tobacco abuse.  She  has a history of VT as well.  She denies any intercurrent ICD therapies.  Her heart failure is at baseline.  She has mild dyspnea with exertion.   MEDICATIONS:  Her medications today include;  1. Omeprazole 20 a day.  2. Advair 20 two tablets daily.  3. Mexiletine 150 q.i.d.  4. Spiriva.  5. Aspirin 81 two tablets a day.  6. Aldactone 25 every other day.  7. Cozaar 25 a day.  8. Coreg 25 twice a day.  9. Lanoxin 0.25 daily.  10.Zetia 10 mg daily.   PHYSICAL EXAMINATION:  GENERAL:  She is a pleasant, well-appearing 70-  year-old woman, in no distress.  VITAL SIGNS:  Blood pressure is 134/74, the pulse 64 and regular,  respirations 18, the weight was 122 pounds.  NECK:  No jugular venous distention.  LUNGS:  Clear bilaterally to auscultation.  No wheezes, rales, or  rhonchi.  ABDOMEN:  Soft and nontender.  CARDIOVASCULAR:  Regular rate and rhythm.  Normal S1 and S2.  EXTREMITIES:  Demonstrated no edema.   Interrogation of her defibrillator demonstrates a Guidant dual-chamber  device.  The battery voltage was 2.59.  the P-waves were 2.  The R-waves  were 13.  The impedance was 457 in the A and 655 in the V.  The  threshold was 1 volt at 0.5 in the A and 0.6 at 0.5 in the V.  She was  3% A-paced.   IMPRESSION:  1. Ischemic cardiomyopathy.  2. Ventricular tachycardia.  3. Status post implantable cardioverter-defibrillator insertion.  4. Congestive  heart failure.   DISCUSSION:  Overall, Ms. Avellino is stable.  Her defibrillator is working  normally.  I have encouraged her to stop smoking.  We will see her back  in the office in 1 year for ICD followup.  She will follow up in our  [facility] program.     Doylene Canning. Ladona Ridgel, MD  Electronically Signed    GWT/MedQ  DD: 06/13/2008  DT: 06/14/2008  Job #: [id number]

## 2010-12-18 NOTE — H&P (Signed)
NAMEEDA, MAGNUSSEN               ACCOUNT NO.:  [id number]   MEDICAL RECORD NO.:  [id number]          PATIENT TYPE:  INP   LOCATION:  1828                         FACILITY:  [facility]   PHYSICIAN:  Kela Millin, M.D.DATE OF BIRTH:  05-23-1941   DATE OF ADMISSION:  09/21/2007  DATE OF DISCHARGE:                              HISTORY & PHYSICAL   CHIEF COMPLAINT:  Persistent shortness of breath.   HISTORY OF PRESENT ILLNESS:  The patient is a 70 year old white female  with past medical history significant for COPD with continued tobacco  abuse; nonischemic cardiomyopathy and CHF, last ejection fraction 40% in  2007; history of nonsustained ventricular tachycardia status post ICD  insertion, who presents with above complaints.  She states that her  shortness of breath began 5 days ago, and she saw Dr. Donette Larry at the  office the next day.  She was then started on amoxicillin and  prednisone.  She reports that early on Saturday, she felt better, but by  the evening she could hear herself wheezing, and she was more short of  breath.  So the next day (the day prior to this admission), she came to  the ER and received 4 nebulized treatments and prednisone and was  discharged home to continue with the prednisone as well as antibiotics.  She states that today she was not feeling any better, and so she came to  the ER.  She admits to a cough productive of very little brownish  sputum, and she is short of breath with minimal exertion.  She denies  fevers, dysuria, diarrhea, melena, hematochezia.  Also no reports of  chest pain.   The patient was seen in the ER, and it was noted that her chest x-ray on  February 15 did not show any acute infiltrates.  Her white cell count on  February 15 was 17.1.  (The patient has been on steroids.)  She is  admitted for further evaluation and management.   PAST MEDICAL HISTORY:  As above.   MEDICATIONS:  1. Mexiletine 150 mg p.o. b.i.d.  2. Crestor 10 mg  p.o. daily.  3. Cozaar 25 mg  daily.  4. Coreg 25 mg in morning and 1/2 tablet in the evening.  5. Lanoxin 0.25 mg daily.  6. Spironolactone 25 mg daily.  7. Aspirin 125 mg daily.  8. Spiriva daily.  9. Ventolin 4 times a day.  10.Fish oil.  11.Prednisone.  12.Zithromax.   ALLERGIES:  No known drug allergies.   SOCIAL HISTORY:  She continues to smoke 10 cigarettes per day. She  denies alcohol.   FAMILY HISTORY:  Positive for cardiomyopathy and diabetes.   REVIEW OF SYSTEMS:  As per HPI.  Other Review of Systems negative.   PHYSICAL EXAMINATION:  GENERAL:  The patient is a thin-appearing,  elderly white female in no respiratory distress with nasal cannula  oxygen on.  VITAL SIGNS:  Temperature 97 with blood pressure of 160/86, pulse 86,  respiratory rate 20, O2 saturation 93%.  HEENT:  PERRLA.  EOMI.  Sclerae anicteric, mucous membranes.  NECK:  Supple.  No  adenopathy, no thyromegaly, and no JVD.  LUNGS:  Scattered bilateral expiratory wheezes.  CARDIOVASCULAR:  Regular rate and rhythm.  Normal S1 and S2.  ABDOMEN:  Soft.  Bowel sounds present.  Nontender, nondistended, no  organomegaly, and no masses palpable.  EXTREMITIES:  No cyanosis and no edema.  NEUROLOGIC:  Alert and oriented x3.  Cranial nerves II-XII grossly  intact.  Nonfocal exam.   LABORATORY DATA:  On September 20, 2007, white cell count of 17.1,  hemoglobin 16.2, hematocrit 48.8, platelet count 397.   Chest x-ray as per HPI.   ASSESSMENT AND PLAN:  1. Chronic obstructive pulmonary disease exacerbation.  As discussed      above, failed outpatient management.  Will place on IV steroids,      nebulized bronchodilators, expectorants, and antibiotics.  Will      also obtain cardiac enzymes and follow.  2. Nonischemic cardiomyopathy and congestive heart failure:  Continue      outpatient medications, compensated.  3. History of nonsustained ventricular tachycardia status post      implantable  cardioverter-defibrillator.  Continue outpatient      medications.  4. Hypertension.  Continue outpatient medications.      Kela Millin, M.D.  Electronically Signed     ACV/MEDQ  D:  09/21/2007  T:  09/21/2007  Job:  [id number]   cc:   Georgann Housekeeper, MD  Doylene Canning. Ladona Ridgel, MD

## 2010-12-18 NOTE — Consult Note (Signed)
[name], [name] NO.:  [id number]   MEDICAL RECORD NO.:  [id number]          Jimenez TYPE:  INP   LOCATION:  2114                         FACILITY:  [facility]   PHYSICIAN:  Veverly Fells. Excell Seltzer, MD  DATE OF BIRTH:  [birth year]   DATE OF CONSULTATION:  07/19/2008  DATE OF DISCHARGE:                                 CONSULTATION   REASON FOR CONSULTATION:  Cardiac arrest.   PRIMARY CARDIOLOGIST:  Lewayne Bunting, MD.   HISTORY OF PRESENT ILLNESS:  [name] is a 70 year old woman with  history of nonischemic cardiomyopathy and ventricular tachycardia.  She  has undergone ICD placement and has had no therapies delivered over Lindsey  past year.  She was apparently in her usual state of health when she  developed sudden onset of respiratory distress.  She went to her husband  and asked him to call 911.  Her husband is blind and had difficulty  completing this task.  Apparently, she was able to dial 911 and then  collapsed.  Upon EMS arrival, [name] was in full cardiac arrest and  her initial rhythm was PEA.  She was resuscitated with CPR and  intubation with mechanical ventilation.  There was visualization of a  foreign body in her airway which was thought to be a piece of hard  candy.  Her total downtime was estimated at 12 to 15 minutes.  She has  been subsequently resuscitated and now is hemodynamically stable in Lindsey  ICU.  There is no history available at this time.  In reviewing Lindsey  admission records, she had been in her usual state of health prior to  this event.  She was last seen in our office by Dr. Ladona Ridgel on June 13, 2008, and was doing well with Lindsey exception of mild exertional  dyspnea.   HOME MEDICATIONS:  As listed by Lindsey June 13, 2008, office note,  include:  1. Omeprazole 20 mg daily.  2. Advair.  3. Mexiletine 150 mg 4 times daily.  4. Spiriva.  5. Aspirin 81 mg 2 tablets daily.  6. Aldactone 25 mg every other day.  7. Cozaar 25 mg daily.  8. Coreg 25 mg b.i.d.  9. Lanoxin 0.25 mg daily.  10.Zetia 10 mg daily.   PAST MEDICAL HISTORY:  1. Cardiomyopathy with LVEF approximately 40%.  In reviewing Lindsey      notes, there is mention of both ischemic and nonischemic      cardiomyopathy.  It sounds like her myopathy is most consistent      with a nonischemic etiology and likely due to familial      cardiomyopathy.  2. Chronic systolic heart failure secondary to #1.  3. History of VT status post ICD placement.  4. Remote splenectomy.  5. Remote esophageal surgery.  6. Cholecystectomy.  7. COPD.  8. Ongoing tobacco use.   FAMILY HISTORY:  Not available and noncontributory.   SOCIAL HISTORY:  [name] lives independently with her husband.  She  smokes 1/2 pack of cigarettes daily.   REVIEW OF SYSTEMS:  Unable to obtain.  PHYSICAL EXAM:  Jimenez is intubated and sedated.  Her heart rate is in  Lindsey 80s.  Blood pressure 150/80.  Respiratory rate 16.  HEENT:  Normal.  NECK:  JVP normal.  Carotids are 2+ without bruit.  No thyromegaly or  thyroid nodules.  LUNGS:  Clear.  HEART:  Regular rate and rhythm without murmurs or gallops.  Lindsey apex is  discrete and nondisplaced.  There is no right ventricular heave or lift.  ABDOMEN:  Soft.  Positive bowel sounds.  No masses.  EXTREMITIES:  Lindsey feet are warm and appear well perfused.  Lindsey pedal  pulses are 3+ and equal.  There is no clubbing, cyanosis, or edema.  SKIN:  Warm and dry with no rash.  NEUROLOGIC:  [name] is minimally responsive on sedation with a  Diprivan drip.  LYMPHATICS:  No adenopathy.   EKG shows normal sinus rhythm with nonspecific IVCD and an age  indeterminate anteroseptal MI.   Telemetry review shows sinus rhythm with a few single PVCs.   Chest x-ray showed no acute pulmonary findings.  Chronic lung changes  are present.  There is no edema.  Pacer wires and AICD are in stable  position.  Cardiac silhouette within normal limits.   Cardiac  enzymes are negative with a troponin of 0.01 and a CK-MB of 3.2,  total CK 118, creatinine 0.9, potassium 3.1.  CBC shows a white count of  22,000, hemoglobin 14.5, platelet count 357,000.   ASSESSMENT:  1. Status post cardiopulmonary arrest, presumably secondary to foreign      body aspiration.  2. Nonischemic cardiomyopathy.  3. History of ventricular tachycardia status post implantable      cardioverter-defibrillator.   RECOMMENDATIONS:  It does not appear that arrhythmia was Lindsey cause of  [name] arrest.  However, since she has an ICD in place, we will ask  Lindsey [employer] representative to interrogate for further evaluation.  She  currently is hemodynamically stable with no evidence of myocardial  infarction or an ongoing ischemic process.  Her prognosis will likely be  dependent on her neurologic recovery.  A neurology consultation has been  placed by Lindsey primary service.  We will follow along during Lindsey  Jimenez's hospitalization.   Thanks for Lindsey opportunity to see [name].  Please feel free to call at  any time with questions.      Veverly Fells. Excell Seltzer, MD  Electronically Signed    MDC/MEDQ  D:  07/19/2008  T:  07/19/2008  Job:  [id number]

## 2010-12-21 NOTE — Discharge Summary (Signed)
NAMEAZAYA, GOEDDE NO.:  [id number]   MEDICAL RECORD NO.:  [id number]          PATIENT TYPE:  INP   LOCATION:  4742                         FACILITY:  [facility]   PHYSICIAN:  Georgann Housekeeper, MD      DATE OF BIRTH:  1940/10/09   DATE OF ADMISSION:  11/19/2005  DATE OF DISCHARGE:  11/23/2005                                 DISCHARGE SUMMARY   DISCHARGE DIAGNOSES:  1.  Severe chronic obstructive pulmonary disease exacerbation, right      pneumonia.  2.  Congestive heart failure non-Q myocardial infarction, secondary hypoxia.  3.  Hypotension.   DISCHARGE MEDICATIONS:  1.  Coreg 12.5 mg p.o. b.i.d.  2.  Lasix 40 mg p.o. daily.  3.  Cozaar 50 mg p.o. daily.  4.  Digoxin 0.25 mg p.o. daily.  5.  Spiriva inhaler once a day.  6.  Nebulizer Xopenex four times a day as needed.  7.  Ativan 0.5 mg q.8h. p.r.n.  8.  Oxygen two liters nasal cannula.  9.  Prednisone taper 20 mg three tablets for two days, two tablets for three      days and one tablet for three days.  10. Avelox 400 mg once a day for seven days.  11. Aspirin 81 mg daily.   FOLLOW UP:  Follow-up with me in one to two weeks.   CONSULTING PHYSICIAN:  As far as the consultation, cardiology with Dr.  Jenne Campus.   HOSPITAL COURSE:  The patient is a 70 year old female with a history of  COPD, CHF and CAD, ejection fraction of 25 percent, with the history of  pacemaker, hypertension, admitted with severe hypoxia.   Problem 1.  Pulmonary.  The patient has dyspnea and respiratory distress.  She required 100 percent O2 and then subsequently was put on BiPAP.  A chest  x-ray showed questionable right lower lobe atelectasis, also early  infiltrate.  Her white blood cell count  remained stable.  She was started  on Solu-Medrol IV, nebulizer, O2 and IV Avelox.  Her white blood cell count  did improve.  No fevers.  The patient was in the step-down unit.  She came  off the BiPAP, continued her nebulizers and  Solu-Medrol and IV Avelox, which  was switched to p.o. prednisone and p.o. Avelox and nebulizer.  The  patient's pulmonary status remained stable.  As far as her secondary issues  with cardiac, the patient had hypotension, volume depletion.  IV fluids  started and IV dopamine.  The patient had ejection fraction of 25 percent.  Had seen Dr. Jenne Campus, cardiology, in the past.  Her cardiac markers were  positive.  Troponin was elevated.  Cardiology was consulted and she was  weaned off the dopamine and had no chest pain or shortness of breath.  Blood  pressure improved.  She remained in paced rhythm.  Her echocardiogram showed  ejection fraction about 25 to 30 percent, unchanged from previous.  She had  negative catheterization in February of 2006 in [place].  Because of her  previous workup with cardiac cath, cardiology decided not to  proceed any  further with catheterization with this admission.  Was thought to be  secondary to hypoxic-induced elevated troponin.  The patient had been free  of symptoms.  As far as the cardiac rehab was consulted.  Her white blood  cell count  initially elevated to 31,000 came down to 20,000 and the patient remains on  two liters of oxygen and was stable hemodynamically.  As far as her  medications were restarted back including the Lasix, Cozaar and digoxin, and  as far as the follow-up with me and Dr. Jenne Campus as discussed above.      Georgann Housekeeper, MD  Electronically Signed     KH/MEDQ  D:  01/31/2006  T:  01/31/2006  Job:  [id number]

## 2010-12-21 NOTE — Discharge Summary (Signed)
[name], [name]               ACCOUNT NO.:  [id number]   MEDICAL RECORD NO.:  [id number]          PATIENT TYPE:  INP   LOCATION:  6532                         FACILITY:  [facility]   PHYSICIAN:  Georgann Housekeeper, MD      DATE OF BIRTH:  [birth year]   DATE OF ADMISSION:  07/19/2008  DATE OF DISCHARGE:  07/29/2008                               DISCHARGE SUMMARY   DISCHARGE DIAGNOSES:  1. Respiratory arrest after choking.  2. Anoxic encephalopathy, resolved.  3. Severe chronic obstructive pulmonary disease.  4. History of congestive heart failure and coronary artery disease      with automatic implantable cardioverter-defibrillator.  5. Pneumonia.   MEDICATION ON DISCHARGE:  1. Lorazepam 1 mg q. 6 p.r.n.  2. Avapro 150 mg daily.  3. Cozaar 25 mg daily.  4. Coreg 25 mg b.i.d.  5. Lanoxin 0.25 mg daily.  6. Zetia 10 mg daily.  7. Aldactone 25 mg every other day.  8. Mexiletine 150 mg 2 tablets twice a day.  9. Spiriva inhaler once a day.  10.Aspirin 81 mg one a day.  11.Prilosec 20 mg daily.  12.Advair Diskus 250/50 b.i.d.  13.Albuterol nebulizer p.r.n.   HOSPITAL COURSE:  A 70 year old female admitted after respiratory  distress.  On July 19, 2008, she was found at home with choking  episode, came in with respiratory arrest, intubated, and was followed by  Critical Care Service and ICU.  The patient was started on broad-  spectrum antibiotic.  She did have some anoxic injury.  Neurology was  consulted.  The patient started to improve with mental status completely  resolved.  She was extubated, found to have bronchial washing showed  some MRSA positive, was started on vancomycin, was continued on  vancomycin throughout until we finished the course.  Mental status  completely recovered.  She was followed in the ICU closely, extubated,  and was sent to the floor.  The patient continues to improve as far as  cardiac  issue, the patient remained stable.  Cardiology was consulted  and no  evidence of any arrhythmias or evidence of CHF.  The patient finished  the antibiotics in the hospital and was sent home with her usual  medications, cardiac as well as respiratory.      Georgann Housekeeper, MD  Electronically Signed     KH/MEDQ  D:  09/21/2008  T:  09/21/2008  Job:  [id number]

## 2010-12-21 NOTE — H&P (Signed)
[name], HORNSTEIN NO.:  [id number]   MEDICAL RECORD NO.:  [id number]          PATIENT TYPE:  INP   LOCATION:  1829                         FACILITY:  [facility]   PHYSICIAN:  Georgann Housekeeper, MD      DATE OF BIRTH:  [birth year]   DATE OF ADMISSION:  11/19/2005  DATE OF DISCHARGE:                                HISTORY & PHYSICAL   CHIEF COMPLAINT:  Shortness of breath.   A 70 year old female with history of COPD, tobacco use, coronary artery  disease, CHF, hypertension, presented to the emergency room by the EMS with  respiratory distress.  The patient was found to start having acute shortness  of breath and was tight and wheezy.  Cannot get a breath.  She denies any  prior sickness, although she said she had a little bit wheezing, no cough,  no fevers, no nausea, vomiting, diarrhea.  Denied any chest pain.  She was  found to be hypoxic by the EMS and tachycardic.  They thought it was because  of her history.  They gave her some Lasix en route and adenosine because of  tachycardia.   In the ER, she was found to be tight and was put on BiPAP and got some  nebulizer and Solu-Medrol.  Started getting better.  Her BNP was 108,  normal.  Initial set of cardiac markers normal.  The patient reportedly had  a similar episode in [place] about one year ago and she still continues to  smoke.   ALLERGIES:  No known drug allergies.   CURRENT MEDICATIONS:  1.  Nebulizers:  She takes one or twice a day p.r.n.  2.  Coreg 25 mg b.i.d.  3.  Cozaar 50 mg b.i.d.  4.  Lasix 40 mg daily.  5.  Digoxin 0.125 daily.  6.  Aspirin 81 daily.   PAST MEDICAL HISTORY:  1.  COPD.  2.  Tobacco use.  3.  CAD.  4.  CHF.  EF of 25%.  Cardiologist is Dr. Jenne Campus.  She had a recent echo      done.  Also had a pacemaker placed and the pacemaker check was recent.   PAST SURGICAL HISTORY:  1.  Status post pacemaker ablation.  2.  Gallbladder.  3.  Total abdominal hysterectomy.  4.   Splenectomy.  5.  Esophageal surgery for acid reflux disease.   She is married with a daughter.  Moved from [place].  Positive tobacco,  alcohol.   FAMILY HISTORY:  Noncontributory.   PHYSICAL EXAMINATION:  Blood pressure was 130/80, pulse 100 sinus,  respiratory rate 24, 90% saturations on a BiPAP machine.  Generally, awake  and alert.  She was able to talk sentences.  Pupils reactive.  Extraocular  movements intact.  Cardiovascular exam irregular, tachycardia, S1 and S2.  A  1/6 systolic murmur.  Lungs decreased breath sounds, occasional wheeze.  Abdomen was soft.  Neurologic exam was nonfocal.   LABORATORY DATA:  Chest x-ray showed questionable atelectasis with right  lower lobe infiltrate.  There was some mild cardiomegaly.  EKG normal sinus  rhythm, S1 and S2.  Cardiac markers:  First set was negative, second set  showed troponin 0.07.  Creatinine 1.1.  BMET and CBC pending.  BNP level  108.   IMPRESSION:  A 70 year old female with chronic obstructive pulmonary  disease, tobacco use, congestive heart failure, coronary artery disease,  presented with acute respiratory distress, wheezing, BNP normal.   DISPOSITION:  1.  COPD exacerbation:  Rule out underlying bronchitis or pneumonia.  2.  CHF/CAD:  Plan admit to stepdown unit.  Continue BiPAP machine,      nebulizers and Solu-Medrol.  Antibiotic IV Avelox 400 mg daily.  3.  History of CHF/coronary artery disease:  Continue her current regimen.      We will check cardiac markers and troponin and Dr. Jenne Campus is the      cardiologist.  If there is any cardiac issue, we will let him know.      Georgann Housekeeper, MD  Electronically Signed     KH/MEDQ  D:  11/20/2005  T:  11/20/2005  Job:  [id number]

## 2010-12-21 NOTE — Assessment & Plan Note (Signed)
[facility]                               PULMONARY OFFICE NOTE   ZAKYRA, KUKUK                        MRN:          [id number]  DATE:04/09/2006                            DOB:          07-15-1941    PULMONARY FOLLOW-UP:   PROBLEMS:  1. Chronic obstructive pulmonary disease.  2. Tobacco abuse.  3. Cardiomyopathy/coronary disease/ejection fraction      25%/ablation/pacemaker/defibrillator.  4. Orthostatic hypotension.   HISTORY:  Coughing white phlegm with no real change, no chest pain, no  palpitation.  She likes her current medical regimen and feels stable now in  late summer.  She has not had significant seasonal nasal congestion.   MEDICATIONS:  1. Spiriva.  2. Albuterol rescue inhaler.  3. Coreg 25 mg.  4. Digoxin 0.125 mg.  5. Spironolactone 25 mg.  6. Furosemide 40 mg.  7. Aspirin 81 mg.  8. Home nebulizer with DuoNeb used 2-3 times daily.  9. Avapro 75 mg.  10.Qvar 80 mcg two puffs b.i.d.   No medication allergy.   OBJECTIVE:  VITAL SIGNS:  Weight 125 pounds, BP 90/64, pulse regular at 41,  room air saturation 95%.  CHEST:  Breath sounds diminished, expiratory phase slow but unlabored.  No  dullness, rales or wheeze.  CARDIOVASCULAR:  Heart sounds are slow but regular.  I do not hear murmur or  gallop.  There is no neck vein distention or edema.  LYMPHATIC:  No adenopathy.  EXTREMITIES:  No cyanosis.   RADIOLOGY:  Chest x-ray November 21, 2005, done as a portable film while an  inpatient at [facility] showed a permanent pacemaker.  Chest with mild elevation  of the left hemidiaphragm, heart size upper limits of normal, with clearing  of edema and mild atelectasis.   IMPRESSION:  1. Chronic obstructive pulmonary disease.  2. Ongoing tobacco abuse despite counseling and availability of Chantix.   PLAN:  1. I asked to get a chest x-ray while she was here at this time to get an      upright and two views, but she asked that it  be deferred because she      was helping her husband that day.  2. No changes in her medication list, which we reviewed from a pulmonary      standpoint.  3. Schedule return in 4 months, plan chest x-ray, earlier p.r.n.                                   Clinton D. Maple Hudson, MD, [facility], FACP   CDY/MedQ  DD:  04/11/2006  DT:  04/11/2006  Job #:  [id number]   cc:   Georgann Housekeeper, MD  Darlin Priestly, MD

## 2010-12-21 NOTE — Assessment & Plan Note (Signed)
[facility]                             PULMONARY OFFICE NOTE   WING, GFELLER                        MRN:          [id number]  DATE:08/11/2006                            DOB:          [birth year]    PULMONARY FOLLOWUP   PROBLEMS:  1. Chronic obstructive pulmonary disease.  2. Tobacco abuse.  3. Cardiomyopathy/coronary disease/EF 25%/ablation/pacemaker      defibrillator.  4. Orthostatic hypotension.   HISTORY:  Unfortunately, she continues to smoke.  She was counseled on  this yet again and options were reviewed.  She had gone to an urgent  care with shortness of breath and chest tightness, and was told chest x-  ray was okay, stable in early December.  She says her last cardiac  workup was fine.  There has been no routine cough or wheeze.  She uses  her albuterol inhaler about 4 times a day on some days, none on others.  Housework and the exertion involved seem to be important triggers, but I  cannot tell that it is dust as much as exertion.  She denies  palpitations or nervousness from her bronchodilators.  Legs ache and she  is going to have Dr. Donette Larry check her potassium when she sees him soon.  She did not want to try again with Chantix.  There has been no chest  pain.  No blood.  Nothing purulent.   MEDICATIONS:  1. Spiriva.  2. Coreg 25 mg.  3. Digoxin 0.25 mg.  4. Spironolactone 25 mg.  5. Furosemide 40 mg.  6. Aspirin 81 mg.  7. Home nebulizer with DuoNeb.  8. Avapro 75 mg.  9. Qvar 80 two puffs b.i.d.  10.Albuterol inhaler.   No medication allergies.   OBJECTIVE:  Weight 129 pounds, BP 118/80, pulse regular 41 per minute.  Room air saturation 96%.  She has faint radial pulses.  There is no edema.   IMPRESSION:  1. Chronic obstructive pulmonary disease.  2. Ongoing tobacco abuse.  3. Bradycardia by palpation with pacemaker.   PLAN:  1. Smoking cessation was re-emphasized.  2. She has a pending appointment with Dr.  Donette Larry and pulse rate should      be rechecked then.  3. Consider long-acting bronchodilator, options discussed.  4. Schedule return in 6 months, earlier p.r.n.     Clinton D. Maple Hudson, MD, [facility], FACP  Electronically Signed    CDY/MedQ  DD: 08/17/2006  DT: 08/17/2006  Job #: [id number]   cc:   Georgann Housekeeper, MD  Darlin Priestly, MD

## 2010-12-21 NOTE — Consult Note (Signed)
[name], [name] NO.:  [id number]   MEDICAL RECORD NO.:  [id number]          PATIENT TYPE:  INP   LOCATION:  3303                         FACILITY:  [facility]   PHYSICIAN:  Lindsey Priestly, MD  DATE OF BIRTH:  29-Nov-1940   DATE OF CONSULTATION:  DATE OF DISCHARGE:                                   CONSULTATION   REFERRING PHYSICIAN:  Dr. Georgann Jimenez.   REASON FOR CONSULTATION:  [name] is a 70 year old white woman who was  admitted tonight to [facility] because of an exacerbation of  chronic obstructive pulmonary disease.  She presented with respiratory  distress and was brought to the emergency department on BiPAP.  Her  condition has improved since presentation to the emergency department at  admission.  However, a Cardiology consultation was requested because of  hypotension.  Her systolic blood pressures have been running in the range of  80-90.   Review of the patient's cardiac disease reveals that she has nonischemic  cardiomyopathy.  She reports that approximately 1 year ago she underwent  cardiac catheterization, which demonstrated on coronary artery disease.  She  is known to have an ejection fraction of approximately 20%.  She also has a  pacemaker in place.  The patient was given intravenous Lasix in the field.   She also has a history of hypertension.   MEDICATIONS:  Coreg, Cozaar, furosemide, digoxin, spironolactone and  aspirin.   ALLERGIES:  None.   OPERATIONS:  1.  Cholecystectomy.  2.  Total abdominal hysterectomy.   FAMILY HISTORY:  Noncontributory.   PHYSICAL EXAMINATION:  VITAL SIGNS:  Blood pressure 84/50.  Pulse 102 and  regular.  Respirations 27.  Temperature 97.5.  GENERAL:  The patient was an older white woman in mild respiratory  discomfort.  She was alert, oriented, appropriate, and responsive.  HEENT:  Head, eyes, nose, and mouth were normal.  NECK:  The neck was without thyromegaly or adenopathy.   Carotid pulses were  palpable bilaterally and without bruits.  CARDIAC:  Examination revealed a regular rhythm.  S1 and S2 were normal.  There was no S3, S4, murmur, rub, or click.  No chest wall tenderness was  noted.  LUNGS:  Examination of the lungs revealed no rales.  There was a diffuse  diminution in breath sounds throughout.  There were no wheezes.  ABDOMEN:  The abdomen was soft and nontender.  There was no mass,  hepatosplenomegaly, bruit, distention, rebound, guarding, or rigidity.  Bowel sounds were normal.  PELVIC, RECTAL AND GENITAL:  Examinations were not performed as they were  not pertinent to the reason for acute care hospitalization.  EXTREMITIES:  The extremities were without edema, deviation, or deformity.  Radial and dorsalis pedal pulses were palpable bilaterally.  NEUROLOGIC:  Brief screening neurologic survey was unremarkable.   LABORATORY AND ACCESSORY CLINICAL DATA:  The electrocardiogram revealed  sinus tachycardia with occasional PVCs.  Anteroseptal Q waves were present,  consistent with a prior anteroseptal myocardial infarction.   The chest radiograph, according to the radiologist, demonstrated a right  lower lobe atelectasis  or infiltrate.   The initial set of cardiac markers revealed a myoglobin of 250, CK-MB 1.6  and troponin less than 0.05.  BNP was 108.  The remaining studies were  pending at the time of this dictation.   IMPRESSION:  1.  Chronic obstructive pulmonary disease exacerbation.  2.  Nonischemic cardiomyopathy.  Ejection fraction previously determined to      be approximately 25%.  There was no evidence of congestive heart failure      today by physical examination or chest radiograph.  In addition, the BNP      is 108.  3.  Hypotension.  This is probably due to volume depletion at this time.      The patient is mentating well.   RECOMMENDATIONS:  1.  Fluid bolus.  2.  Serial cardiac enzymes.  3.  Hold Cozaar.  4.  Other measures  as you have outlined.  5.  The patient can continue on the current dose of dopamine for now.  6.  Dr. Jenne Jimenez will follow with you beginning in the morning.      Lindsey Amor, MD  Electronically Signed      Lindsey Priestly, MD  Electronically Signed    MSC/MEDQ  D:  11/20/2005  T:  11/20/2005  Job:  [id number]   cc:   Lindsey Priestly, MD  Fax: [phone]

## 2010-12-28 ENCOUNTER — Ambulatory Visit: Payer: Medicare Other | Admitting: Internal Medicine

## 2011-01-10 ENCOUNTER — Encounter: Payer: Medicare Other | Admitting: *Deleted

## 2011-01-16 ENCOUNTER — Ambulatory Visit (INDEPENDENT_AMBULATORY_CARE_PROVIDER_SITE_OTHER): Payer: Medicare Other | Admitting: *Deleted

## 2011-01-16 DIAGNOSIS — I428 Other cardiomyopathies: Secondary | ICD-10-CM

## 2011-01-18 ENCOUNTER — Other Ambulatory Visit: Payer: Self-pay | Admitting: Hematology and Oncology

## 2011-01-18 ENCOUNTER — Encounter (HOSPITAL_BASED_OUTPATIENT_CLINIC_OR_DEPARTMENT_OTHER): Payer: Medicare Other | Admitting: Hematology and Oncology

## 2011-01-18 DIAGNOSIS — D72829 Elevated white blood cell count, unspecified: Secondary | ICD-10-CM

## 2011-01-18 LAB — CBC WITH DIFFERENTIAL/PLATELET
Basophils Absolute: 0.1 10*3/uL (ref 0.0–0.1)
Eosinophils Absolute: 0.7 10*3/uL — ABNORMAL HIGH (ref 0.0–0.5)
HCT: 43.3 % (ref 34.8–46.6)
HGB: 14.4 g/dL (ref 11.6–15.9)
MCV: 93.1 fL (ref 79.5–101.0)
MONO%: 11.8 % (ref 0.0–14.0)
NEUT#: 9.4 10*3/uL — ABNORMAL HIGH (ref 1.5–6.5)
NEUT%: 58.2 % (ref 38.4–76.8)
RDW: 13.7 % (ref 11.2–14.5)

## 2011-01-18 LAB — BASIC METABOLIC PANEL
BUN: 11 mg/dL (ref 6–23)
Chloride: 100 mEq/L (ref 96–112)
Glucose, Bld: 103 mg/dL — ABNORMAL HIGH (ref 70–99)
Potassium: 4.2 mEq/L (ref 3.5–5.3)

## 2011-02-13 ENCOUNTER — Ambulatory Visit (HOSPITAL_BASED_OUTPATIENT_CLINIC_OR_DEPARTMENT_OTHER)
Admission: RE | Admit: 2011-02-13 | Discharge: 2011-02-13 | Disposition: A | Discharge status: Home / Self Care | Payer: Medicare Other | Source: Ambulatory Visit | Attending: Specialist | Admitting: Specialist

## 2011-02-13 DIAGNOSIS — I509 Heart failure, unspecified: Secondary | ICD-10-CM | POA: Insufficient documentation

## 2011-02-13 DIAGNOSIS — J449 Chronic obstructive pulmonary disease, unspecified: Secondary | ICD-10-CM | POA: Insufficient documentation

## 2011-02-13 DIAGNOSIS — Z01812 Encounter for preprocedural laboratory examination: Secondary | ICD-10-CM | POA: Insufficient documentation

## 2011-02-13 DIAGNOSIS — J4489 Other specified chronic obstructive pulmonary disease: Secondary | ICD-10-CM | POA: Insufficient documentation

## 2011-02-13 DIAGNOSIS — Z950 Presence of cardiac pacemaker: Secondary | ICD-10-CM | POA: Insufficient documentation

## 2011-02-13 DIAGNOSIS — Z79899 Other long term (current) drug therapy: Secondary | ICD-10-CM | POA: Insufficient documentation

## 2011-02-13 DIAGNOSIS — H269 Unspecified cataract: Secondary | ICD-10-CM | POA: Insufficient documentation

## 2011-02-13 DIAGNOSIS — E785 Hyperlipidemia, unspecified: Secondary | ICD-10-CM | POA: Insufficient documentation

## 2011-02-13 DIAGNOSIS — F411 Generalized anxiety disorder: Secondary | ICD-10-CM | POA: Insufficient documentation

## 2011-02-13 LAB — POCT I-STAT 4, (NA,K, GLUC, HGB,HCT)
Hemoglobin: 16.3 g/dL — ABNORMAL HIGH (ref 12.0–15.0)
Sodium: 134 mEq/L — ABNORMAL LOW (ref 135–145)

## 2011-03-01 NOTE — Op Note (Signed)
  NAMEVEDIKA, Jimenez NO.:  [id number]  MEDICAL RECORD NO.:  [id number]  LOCATION:                                 FACILITY:  PHYSICIAN:  Chucky May, M.D.  DATE OF BIRTH:  11/21/1940  DATE OF PROCEDURE:  02/13/2011 DATE OF DISCHARGE:                              OPERATIVE REPORT   PREOPERATIVE DIAGNOSIS:  Cataract, left eye.  POSTOPERATIVE DIAGNOSIS:  Cataract, left eye.  OPERATION PERFORMED:  Cataract extraction with intraocular lens implant, left eye.  SURGEON:  Chucky May, M.D.  ANESTHESIA:  MAC.  INDICATIONS FOR SURGERY:  The patient is a 70 year old female with painless progressive decrease in vision since she has difficulty seeing or reading.  On examination, she was found to have a cataract consistent with decrease in visual acuity.  The outpatient setting is the appropriate setting for the procedure.  PROCEDURE:  The patient is brought to the main operating room and placed in the supine position.  Anesthesia was obtained by means of topical 4% lidocaine drops with tetracaine.  She was then prepped and draped in the usual manner.  A lid speculum was inserted and the cornea was entered with a micro keratome temporally with an additional port inferiorly. Viscoelastic was instilled into the anterior chamber and an anterior capsulorrhexis was performed without difficulty.  The nucleus was mobilized by hydrodissection with balanced salt solution and 1% nonpreserved lidocaine.  The nucleus was then rotated freely. Phacoemulsification of the nucleus was accomplished, all of residual cortical removal by irrigation and aspiration.  The posterior capsule was polished.  Provisc was instilled to expand the bag and a posterior chamber intraocular lens implant, model number [id number], power 23.0 diopters, was placed in the bag without difficulty.  The viscoelastic was removed by irrigation and aspiration and replaced with balanced salt solution.  The  wounds were hydrated with balanced salt solution and checked for fluid leaks and none were noted.  The eye was dressed with topical Pred Forte and Vigamox and a Fox shield and the patient was taken to recovery room in excellent condition where she received written and verbal instructions along with her family for her care and for her postoperative appointment.          ______________________________ Chucky May, M.D.     DJD/MEDQ  D:  02/13/2011  T:  02/13/2011  Job:  [id number]  Electronically Signed by Nelson Chimes M.D. on 03/01/2011 09:33:57 AM

## 2011-04-18 ENCOUNTER — Ambulatory Visit (INDEPENDENT_AMBULATORY_CARE_PROVIDER_SITE_OTHER): Payer: Medicare Other | Admitting: *Deleted

## 2011-04-18 ENCOUNTER — Encounter: Payer: Self-pay | Admitting: Internal Medicine

## 2011-04-18 DIAGNOSIS — I428 Other cardiomyopathies: Secondary | ICD-10-CM

## 2011-04-18 DIAGNOSIS — I4891 Unspecified atrial fibrillation: Secondary | ICD-10-CM

## 2011-04-18 DIAGNOSIS — Z95810 Presence of automatic (implantable) cardiac defibrillator: Secondary | ICD-10-CM

## 2011-04-18 LAB — ICD DEVICE OBSERVATION
AL AMPLITUDE: 4.1 mv
AL IMPEDENCE ICD: 387.5 Ohm
BAMS-0003: 70 {beats}/min
FVT: 0
RV LEAD AMPLITUDE: 12 mv
RV LEAD IMPEDENCE ICD: 612.5 Ohm
TOT-0007: 1
TOT-0008: 0
TOT-0009: 0
TZAT-0001SLOWVT: 1
TZAT-0012SLOWVT: 200 ms
TZAT-0018SLOWVT: NEGATIVE
TZAT-0019SLOWVT: 7.5 V
TZAT-0020SLOWVT: 1 ms
TZON-0003SLOWVT: 340 ms
TZON-0004SLOWVT: 24
TZON-0005SLOWVT: 6
TZON-0010SLOWVT: 80 ms
TZST-0001SLOWVT: 4
TZST-0003SLOWVT: 30 J
TZST-0003SLOWVT: 40 J
VF: 0

## 2011-04-18 NOTE — Progress Notes (Signed)
icd check in clinic  

## 2011-04-25 LAB — I-STAT 8, (EC8 V) (CONVERTED LAB)
Acid-Base Excess: 3 — ABNORMAL HIGH
BUN: 9
Chloride: 106
Glucose, Bld: 101 — ABNORMAL HIGH
HCT: 50 — ABNORMAL HIGH
Operator id: 294501
Potassium: 3.5
Potassium: 3.9
TCO2: 28
pCO2, Ven: 37.3 — ABNORMAL LOW
pH, Ven: 7.449 — ABNORMAL HIGH
pH, Ven: 7.454 — ABNORMAL HIGH

## 2011-04-25 LAB — POCT I-STAT CREATININE
Creatinine, Ser: 0.7
Creatinine, Ser: 0.7
Operator id: 294341
Operator id: 294501

## 2011-04-25 LAB — COMPREHENSIVE METABOLIC PANEL
AST: 25
Albumin: 3.7
Alkaline Phosphatase: 96
BUN: 8
CO2: 26
Chloride: 101
Creatinine, Ser: 0.66
GFR calc non Af Amer: >60
Potassium: 3.8
Total Bilirubin: 1.4 — ABNORMAL HIGH

## 2011-04-25 LAB — CBC
HCT: 48.4 — ABNORMAL HIGH
MCHC: 33.4
MCV: 94.7
Platelets: 354
RBC: 5.05
RBC: 5.12 — ABNORMAL HIGH
WBC: 13.3 — ABNORMAL HIGH
WBC: 15.4 — ABNORMAL HIGH

## 2011-04-25 LAB — URINALYSIS, ROUTINE W REFLEX MICROSCOPIC
Bilirubin Urine: NEGATIVE
Hgb urine dipstick: NEGATIVE
Nitrite: NEGATIVE
Protein, ur: NEGATIVE
Urobilinogen, UA: 0.2

## 2011-04-25 LAB — URINE CULTURE

## 2011-04-25 LAB — POCT CARDIAC MARKERS
CKMB, poc: <1 — ABNORMAL LOW
CKMB, poc: <1 — ABNORMAL LOW
Myoglobin, poc: 36.4
Operator id: 192351
Operator id: 294341
Troponin i, poc: <0.05
Troponin i, poc: <0.05

## 2011-04-25 LAB — CARDIAC PANEL(CRET KIN+CKTOT+MB+TROPI)
CK, MB: 0.8
CK, MB: 0.9
Relative Index: INVALID
Total CK: 36
Troponin I: <0.01

## 2011-04-25 LAB — BASIC METABOLIC PANEL
CO2: 32
Calcium: 9.1
Creatinine, Ser: 0.72
GFR calc Af Amer: >60
GFR calc non Af Amer: >60
Sodium: 141

## 2011-04-25 LAB — PROTIME-INR: Prothrombin Time: 12

## 2011-04-25 LAB — TSH: TSH: 2.154

## 2011-04-25 LAB — LIPASE, BLOOD: Lipase: 21

## 2011-04-26 LAB — I-STAT 8, (EC8 V) (CONVERTED LAB)
Acid-base deficit: 1
BUN: 8
Bicarbonate: 25.9 — ABNORMAL HIGH
Chloride: 100
Glucose, Bld: 122 — ABNORMAL HIGH
HCT: 52 — ABNORMAL HIGH
Hemoglobin: 17.7 — ABNORMAL HIGH
Operator id: 285491
Potassium: 4.5
Sodium: 133 — ABNORMAL LOW
TCO2: 27
pCO2, Ven: 48.3
pH, Ven: 7.338 — ABNORMAL HIGH

## 2011-04-26 LAB — BASIC METABOLIC PANEL
BUN: 11
Chloride: 101
Potassium: 4.2
Sodium: 135

## 2011-04-26 LAB — CBC
HCT: 46.3 — ABNORMAL HIGH
HCT: 47 — ABNORMAL HIGH
Hemoglobin: 15.6 — ABNORMAL HIGH
Hemoglobin: 15.8 — ABNORMAL HIGH
Hemoglobin: 16.2 — ABNORMAL HIGH
MCHC: 33.7
MCV: 95.6
MCV: 95.7
Platelets: 371
Platelets: 397
Platelets: 407 — ABNORMAL HIGH
RBC: 4.91
RDW: 14.4
RDW: 14.5
WBC: 16.1 — ABNORMAL HIGH
WBC: 16.4 — ABNORMAL HIGH

## 2011-04-26 LAB — DIFFERENTIAL
Basophils Absolute: 0
Basophils Absolute: 0
Basophils Relative: 0
Eosinophils Absolute: 0
Eosinophils Relative: 0
Lymphocytes Relative: 4 — ABNORMAL LOW
Lymphocytes Relative: 6 — ABNORMAL LOW
Lymphs Abs: 0.7
Monocytes Absolute: 0.6
Monocytes Relative: 3
Neutro Abs: 15.1 — ABNORMAL HIGH
Neutro Abs: 15.3 — ABNORMAL HIGH
Neutrophils Relative %: 92 — ABNORMAL HIGH

## 2011-04-26 LAB — POCT CARDIAC MARKERS
Myoglobin, poc: 40.2
Operator id: 285491
Troponin i, poc: <0.05

## 2011-04-26 LAB — CARDIAC PANEL(CRET KIN+CKTOT+MB+TROPI)
CK, MB: 2
Relative Index: INVALID
Total CK: 30
Troponin I: 0.01

## 2011-04-26 LAB — TROPONIN I: Troponin I: 0.01

## 2011-04-26 LAB — CK TOTAL AND CKMB (NOT AT ARMC): CK, MB: 1.9

## 2011-04-26 LAB — POCT I-STAT CREATININE: Operator id: 285491

## 2011-04-26 LAB — DIGOXIN LEVEL: Digoxin Level: 2

## 2011-05-10 LAB — BLOOD GAS, ARTERIAL
Drawn by: 129711
MECHVT: 500 mL
Mode: POSITIVE
PEEP: 5 cmH2O
PEEP: 5 cmH2O
Patient temperature: 98.6
Patient temperature: 98.9
Pressure support: 5 cmH2O
RATE: 14 resp/min
pCO2 arterial: 46.5 mmHg — ABNORMAL HIGH (ref 35.0–45.0)
pCO2 arterial: 50.5 mmHg — ABNORMAL HIGH (ref 35.0–45.0)
pH, Arterial: 7.357 (ref 7.350–7.400)
pH, Arterial: 7.379 (ref 7.350–7.400)

## 2011-05-10 LAB — GLUCOSE, CAPILLARY
Glucose-Capillary: 107 mg/dL — ABNORMAL HIGH (ref 70–99)
Glucose-Capillary: 110 mg/dL — ABNORMAL HIGH (ref 70–99)
Glucose-Capillary: 113 mg/dL — ABNORMAL HIGH (ref 70–99)
Glucose-Capillary: 113 mg/dL — ABNORMAL HIGH (ref 70–99)
Glucose-Capillary: 113 mg/dL — ABNORMAL HIGH (ref 70–99)
Glucose-Capillary: 113 mg/dL — ABNORMAL HIGH (ref 70–99)
Glucose-Capillary: 118 mg/dL — ABNORMAL HIGH (ref 70–99)
Glucose-Capillary: 121 mg/dL — ABNORMAL HIGH (ref 70–99)
Glucose-Capillary: 121 mg/dL — ABNORMAL HIGH (ref 70–99)
Glucose-Capillary: 123 mg/dL — ABNORMAL HIGH (ref 70–99)
Glucose-Capillary: 123 mg/dL — ABNORMAL HIGH (ref 70–99)
Glucose-Capillary: 130 mg/dL — ABNORMAL HIGH (ref 70–99)
Glucose-Capillary: 130 mg/dL — ABNORMAL HIGH (ref 70–99)
Glucose-Capillary: 132 mg/dL — ABNORMAL HIGH (ref 70–99)
Glucose-Capillary: 135 mg/dL — ABNORMAL HIGH (ref 70–99)
Glucose-Capillary: 144 mg/dL — ABNORMAL HIGH (ref 70–99)
Glucose-Capillary: 146 mg/dL — ABNORMAL HIGH (ref 70–99)
Glucose-Capillary: 149 mg/dL — ABNORMAL HIGH (ref 70–99)
Glucose-Capillary: 151 mg/dL — ABNORMAL HIGH (ref 70–99)
Glucose-Capillary: 151 mg/dL — ABNORMAL HIGH (ref 70–99)
Glucose-Capillary: 160 mg/dL — ABNORMAL HIGH (ref 70–99)
Glucose-Capillary: 169 mg/dL — ABNORMAL HIGH (ref 70–99)
Glucose-Capillary: 93 mg/dL (ref 70–99)
Glucose-Capillary: 99 mg/dL (ref 70–99)

## 2011-05-10 LAB — BASIC METABOLIC PANEL
BUN: 10 mg/dL (ref 6–23)
BUN: 10 mg/dL (ref 6–23)
BUN: 13 mg/dL (ref 6–23)
BUN: 15 mg/dL (ref 6–23)
BUN: 5 mg/dL — ABNORMAL LOW (ref 6–23)
BUN: 6 mg/dL (ref 6–23)
BUN: 9 mg/dL (ref 6–23)
BUN: 9 mg/dL (ref 6–23)
CO2: 26 mEq/L (ref 19–32)
CO2: 28 mEq/L (ref 19–32)
CO2: 29 mEq/L (ref 19–32)
CO2: 30 mEq/L (ref 19–32)
CO2: 30 mEq/L (ref 19–32)
CO2: 30 mEq/L (ref 19–32)
CO2: 33 mEq/L — ABNORMAL HIGH (ref 19–32)
Calcium: 8.4 mg/dL (ref 8.4–10.5)
Calcium: 8.4 mg/dL (ref 8.4–10.5)
Calcium: 8.4 mg/dL (ref 8.4–10.5)
Calcium: 8.4 mg/dL (ref 8.4–10.5)
Calcium: 8.5 mg/dL (ref 8.4–10.5)
Chloride: 100 mEq/L (ref 96–112)
Chloride: 101 mEq/L (ref 96–112)
Chloride: 104 mEq/L (ref 96–112)
Chloride: 104 mEq/L (ref 96–112)
Chloride: 104 mEq/L (ref 96–112)
Chloride: 109 mEq/L (ref 96–112)
Creatinine, Ser: 0.54 mg/dL (ref 0.4–1.2)
Creatinine, Ser: 0.62 mg/dL (ref 0.4–1.2)
Creatinine, Ser: 0.74 mg/dL (ref 0.4–1.2)
Creatinine, Ser: 0.95 mg/dL (ref 0.4–1.2)
GFR calc Af Amer: >60 mL/min (ref >60)
GFR calc Af Amer: >60 mL/min (ref >60)
GFR calc Af Amer: >60 mL/min (ref >60)
GFR calc non Af Amer: 59 mL/min — ABNORMAL LOW (ref >60)
GFR calc non Af Amer: >60 mL/min (ref >60)
GFR calc non Af Amer: >60 mL/min (ref >60)
GFR calc non Af Amer: >60 mL/min (ref >60)
GFR calc non Af Amer: >60 mL/min (ref >60)
GFR calc non Af Amer: >60 mL/min (ref >60)
GFR calc non Af Amer: >60 mL/min (ref >60)
GFR calc non Af Amer: >60 mL/min (ref >60)
GFR calc non Af Amer: >60 mL/min (ref >60)
GFR calc non Af Amer: >60 mL/min (ref >60)
Glucose, Bld: 109 mg/dL — ABNORMAL HIGH (ref 70–99)
Glucose, Bld: 117 mg/dL — ABNORMAL HIGH (ref 70–99)
Glucose, Bld: 118 mg/dL — ABNORMAL HIGH (ref 70–99)
Glucose, Bld: 126 mg/dL — ABNORMAL HIGH (ref 70–99)
Glucose, Bld: 80 mg/dL (ref 70–99)
Glucose, Bld: 94 mg/dL (ref 70–99)
Glucose, Bld: 97 mg/dL (ref 70–99)
Potassium: 2.7 mEq/L — CL (ref 3.5–5.1)
Potassium: 3.4 mEq/L — ABNORMAL LOW (ref 3.5–5.1)
Potassium: 3.5 mEq/L (ref 3.5–5.1)
Potassium: 3.5 mEq/L (ref 3.5–5.1)
Potassium: 3.7 mEq/L (ref 3.5–5.1)
Potassium: 3.7 mEq/L (ref 3.5–5.1)
Potassium: 4.6 mEq/L (ref 3.5–5.1)
Sodium: 138 mEq/L (ref 135–145)
Sodium: 138 mEq/L (ref 135–145)
Sodium: 138 mEq/L (ref 135–145)
Sodium: 139 mEq/L (ref 135–145)
Sodium: 139 mEq/L (ref 135–145)
Sodium: 141 mEq/L (ref 135–145)
Sodium: 142 mEq/L (ref 135–145)

## 2011-05-10 LAB — CBC
HCT: 34.5 % — ABNORMAL LOW (ref 36.0–46.0)
HCT: 36 % (ref 36.0–46.0)
HCT: 37.4 % (ref 36.0–46.0)
HCT: 38.1 % (ref 36.0–46.0)
HCT: 42 % (ref 36.0–46.0)
HCT: 42.6 % (ref 36.0–46.0)
Hemoglobin: 11.2 g/dL — ABNORMAL LOW (ref 12.0–15.0)
Hemoglobin: 11.6 g/dL — ABNORMAL LOW (ref 12.0–15.0)
Hemoglobin: 12.4 g/dL (ref 12.0–15.0)
Hemoglobin: 12.8 g/dL (ref 12.0–15.0)
Hemoglobin: 13.6 g/dL (ref 12.0–15.0)
Hemoglobin: 13.8 g/dL (ref 12.0–15.0)
Hemoglobin: 14.7 g/dL (ref 12.0–15.0)
MCHC: 33.1 g/dL (ref 30.0–36.0)
MCHC: 33.1 g/dL (ref 30.0–36.0)
MCHC: 34 g/dL (ref 30.0–36.0)
MCHC: 34.8 g/dL (ref 30.0–36.0)
MCV: 96.7 fL (ref 78.0–100.0)
MCV: 96.7 fL (ref 78.0–100.0)
MCV: 97.8 fL (ref 78.0–100.0)
MCV: 98.5 fL (ref 78.0–100.0)
MCV: 98.9 fL (ref 78.0–100.0)
MCV: 99 fL (ref 78.0–100.0)
MCV: 99.5 fL (ref 78.0–100.0)
Platelets: 280 10*3/uL (ref 150–400)
Platelets: 311 10*3/uL (ref 150–400)
Platelets: 344 10*3/uL (ref 150–400)
Platelets: 357 10*3/uL (ref 150–400)
Platelets: 441 10*3/uL — ABNORMAL HIGH (ref 150–400)
Platelets: 530 10*3/uL — ABNORMAL HIGH (ref 150–400)
RBC: 3.64 MIL/uL — ABNORMAL LOW (ref 3.87–5.11)
RBC: 4.26 MIL/uL (ref 3.87–5.11)
RBC: 4.27 MIL/uL (ref 3.87–5.11)
RBC: 4.47 MIL/uL (ref 3.87–5.11)
RDW: 12.9 % (ref 11.5–15.5)
RDW: 13 % (ref 11.5–15.5)
RDW: 13 % (ref 11.5–15.5)
RDW: 13.1 % (ref 11.5–15.5)
RDW: 13.1 % (ref 11.5–15.5)
RDW: 13.3 % (ref 11.5–15.5)
WBC: 17.5 10*3/uL — ABNORMAL HIGH (ref 4.0–10.5)
WBC: 18.9 10*3/uL — ABNORMAL HIGH (ref 4.0–10.5)
WBC: 19.6 10*3/uL — ABNORMAL HIGH (ref 4.0–10.5)
WBC: 21.8 10*3/uL — ABNORMAL HIGH (ref 4.0–10.5)
WBC: 26.1 10*3/uL — ABNORMAL HIGH (ref 4.0–10.5)

## 2011-05-10 LAB — POCT I-STAT 3, ART BLOOD GAS (G3+)
Acid-Base Excess: 3 mmol/L — ABNORMAL HIGH (ref 0.0–2.0)
Acid-Base Excess: 7 mmol/L — ABNORMAL HIGH (ref 0.0–2.0)
Bicarbonate: 14.8 mEq/L — ABNORMAL LOW (ref 20.0–24.0)
Bicarbonate: 28.9 mEq/L — ABNORMAL HIGH (ref 20.0–24.0)
O2 Saturation: 97 %
Patient temperature: 100
Patient temperature: 37
Patient temperature: 98.7
TCO2: 17 mmol/L (ref 0–100)
TCO2: 30 mmol/L (ref 0–100)
pCO2 arterial: 45.3 mmHg — ABNORMAL HIGH (ref 35.0–45.0)
pCO2 arterial: 57.8 mmHg (ref 35.0–45.0)
pH, Arterial: 7.017 — CL (ref 7.350–7.400)

## 2011-05-10 LAB — TRIGLYCERIDES
Triglycerides: 252 mg/dL — ABNORMAL HIGH (ref <150)
Triglycerides: 81 mg/dL (ref <150)

## 2011-05-10 LAB — DIFFERENTIAL
Basophils Relative: 0 % (ref 0–1)
Eosinophils Relative: 4 % (ref 0–5)
Monocytes Relative: 12 % (ref 3–12)
Neutrophils Relative %: 31 % — ABNORMAL LOW (ref 43–77)

## 2011-05-10 LAB — CULTURE, BLOOD (ROUTINE X 2): Culture: NO GROWTH

## 2011-05-10 LAB — POCT I-STAT, CHEM 8
BUN: 10 mg/dL (ref 6–23)
Calcium, Ion: 1.15 mmol/L (ref 1.12–1.32)
Chloride: 103 mEq/L (ref 96–112)
HCT: 47 % — ABNORMAL HIGH (ref 36.0–46.0)
Potassium: 4.6 mEq/L (ref 3.5–5.1)
Sodium: 142 mEq/L (ref 135–145)

## 2011-05-10 LAB — CULTURE, BAL-QUANTITATIVE W GRAM STAIN: Colony Count: 80000

## 2011-05-10 LAB — CARDIAC PANEL(CRET KIN+CKTOT+MB+TROPI): Troponin I: 0.01 ng/mL (ref 0.00–0.06)

## 2011-05-10 LAB — PHOSPHORUS: Phosphorus: 3.6 mg/dL (ref 2.3–4.6)

## 2011-05-10 LAB — POCT CARDIAC MARKERS: Myoglobin, poc: 60.6 ng/mL (ref 12–200)

## 2011-05-10 LAB — MAGNESIUM: Magnesium: 1.9 mg/dL (ref 1.5–2.5)

## 2011-05-10 LAB — DIGOXIN LEVEL: Digoxin Level: 0.7 ng/mL — ABNORMAL LOW (ref 0.8–2.0)

## 2011-05-14 LAB — URINALYSIS, ROUTINE W REFLEX MICROSCOPIC
Glucose, UA: NEGATIVE
Hgb urine dipstick: NEGATIVE
Ketones, ur: NEGATIVE
Protein, ur: NEGATIVE
Urobilinogen, UA: 1

## 2011-05-14 LAB — CBC
HCT: 48.1 — ABNORMAL HIGH
MCV: 95.6
Platelets: 370
RDW: 13.3

## 2011-05-14 LAB — COMPREHENSIVE METABOLIC PANEL
AST: 24
Albumin: 3.4 — ABNORMAL LOW
BUN: 5 — ABNORMAL LOW
Calcium: 9.2
Creatinine, Ser: 0.63
GFR calc Af Amer: >60
Total Protein: 5.6 — ABNORMAL LOW

## 2011-05-14 LAB — DIFFERENTIAL
Basophils Absolute: 0.1
Lymphocytes Relative: 24
Lymphs Abs: 3.2
Monocytes Absolute: 1.6 — ABNORMAL HIGH
Monocytes Relative: 12 — ABNORMAL HIGH
Neutro Abs: 8 — ABNORMAL HIGH

## 2011-05-29 ENCOUNTER — Other Ambulatory Visit: Payer: Self-pay | Admitting: Internal Medicine

## 2011-05-29 DIAGNOSIS — J189 Pneumonia, unspecified organism: Secondary | ICD-10-CM

## 2011-06-03 ENCOUNTER — Ambulatory Visit
Admission: RE | Admit: 2011-06-03 | Discharge: 2011-06-03 | Disposition: A | Discharge status: Home / Self Care | Payer: Medicare Other | Source: Ambulatory Visit | Attending: Internal Medicine | Admitting: Internal Medicine

## 2011-06-03 DIAGNOSIS — J189 Pneumonia, unspecified organism: Secondary | ICD-10-CM

## 2011-06-03 MED ORDER — IOHEXOL 300 MG/ML  SOLN
75.0000 mL | Freq: Once | INTRAMUSCULAR | Status: AC | PRN
  Administered 2011-06-03: 75 mL via INTRAVENOUS

## 2011-06-07 ENCOUNTER — Other Ambulatory Visit: Payer: Self-pay | Admitting: Internal Medicine

## 2011-06-10 ENCOUNTER — Other Ambulatory Visit: Payer: Self-pay

## 2011-06-10 MED ORDER — CARVEDILOL 25 MG PO TABS: 25.0000 mg | ORAL_TABLET | Freq: Every day | ORAL | Status: DC

## 2011-07-17 ENCOUNTER — Encounter: Payer: Self-pay | Admitting: Internal Medicine

## 2011-07-18 ENCOUNTER — Ambulatory Visit (INDEPENDENT_AMBULATORY_CARE_PROVIDER_SITE_OTHER): Payer: Medicare Other | Admitting: Internal Medicine

## 2011-07-18 ENCOUNTER — Encounter: Payer: Self-pay | Admitting: Internal Medicine

## 2011-07-18 DIAGNOSIS — I4891 Unspecified atrial fibrillation: Secondary | ICD-10-CM

## 2011-07-18 DIAGNOSIS — Z95810 Presence of automatic (implantable) cardiac defibrillator: Secondary | ICD-10-CM

## 2011-07-18 DIAGNOSIS — I428 Other cardiomyopathies: Secondary | ICD-10-CM

## 2011-07-18 DIAGNOSIS — F17200 Nicotine dependence, unspecified, uncomplicated: Secondary | ICD-10-CM

## 2011-07-18 LAB — ICD DEVICE OBSERVATION
AL AMPLITUDE: 2.7 mv
AL IMPEDENCE ICD: 400 Ohm
AL THRESHOLD: 0.5 V
BAMS-0001: 150 {beats}/min
BAMS-0003: 70 {beats}/min
CHARGE TIME: 9.8 s
DEV-0020ICD: NEGATIVE
DEVICE MODEL ICD: 745555
HV IMPEDENCE: 54 Ohm
RV LEAD AMPLITUDE: 11.9 mv
RV LEAD IMPEDENCE ICD: 680 Ohm
RV LEAD THRESHOLD: 0.75 V
TZAT-0001SLOWVT: 1
TZAT-0004SLOWVT: 8
TZAT-0012SLOWVT: 200 ms
TZAT-0013SLOWVT: 2
TZAT-0018SLOWVT: NEGATIVE
TZAT-0019SLOWVT: 7.5 V
TZAT-0020SLOWVT: 1 ms
TZON-0003SLOWVT: 340 ms
TZON-0004SLOWVT: 24
TZON-0005SLOWVT: 6
TZON-0010SLOWVT: 80 ms
TZST-0001SLOWVT: 2
TZST-0001SLOWVT: 3
TZST-0001SLOWVT: 4
TZST-0003SLOWVT: 30 J
TZST-0003SLOWVT: 40 J

## 2011-07-18 NOTE — Assessment & Plan Note (Signed)
Today I discussed the importance of stopping smoking. She is down to a half pack of cigarettes per day. I have strongly encouraged her to continue reducing her cigarette consumption.

## 2011-07-18 NOTE — Progress Notes (Signed)
HPI [name] returns today for followup. She is a 70 year old woman with a history of an ischemic cardiomyopathy, chronic systolic heart failure, COPD, with ongoing tobacco abuse. The patient denies chest pain. She has class II dyspnea. She has had no recent ICD shocks. She has minimal peripheral edema. She smokes a half-pack of cigarettes daily. No Known Allergies   Current Outpatient Prescriptions  Medication Sig Dispense Refill  . albuterol (VENTOLIN HFA) 108 (90 BASE) MCG/ACT inhaler Inhale into the lungs every 4 (four) hours as needed.        Marland Kitchen aspirin 81 MG tablet Take 81 mg by mouth daily.        Marland Kitchen atorvastatin (LIPITOR) 20 MG tablet Take 10 mg by mouth daily.       . carvedilol (COREG) 25 MG tablet Take 1 tablet (25 mg total) by mouth daily.  60 tablet  6  . digoxin (LANOXIN) 0.25 MG tablet Take 250 mcg by mouth daily.        . Fluticasone-Salmeterol (ADVAIR) 250-50 MCG/DOSE AEPB Inhale 1 puff into the lungs every 12 (twelve) hours.        Marland Kitchen ipratropium-albuterol (DUONEB) 0.5-2.5 (3) MG/3ML SOLN Take 3 mLs by nebulization 4 (four) times daily.        Marland Kitchen LORazepam (ATIVAN) 0.5 MG tablet Take 0.5 mg by mouth 3 (three) times daily as needed.        Marland Kitchen losartan (COZAAR) 25 MG tablet Take 25 mg by mouth daily.       . mometasone (NASONEX) 50 MCG/ACT nasal spray Place 2 sprays into the nose daily as needed.        Marland Kitchen omeprazole (PRILOSEC) 40 MG capsule Take 40 mg by mouth as needed.        Marland Kitchen spironolactone (ALDACTONE) 25 MG tablet Take 25 mg by mouth every other day.        . tiotropium (SPIRIVA) 18 MCG inhalation capsule Place 18 mcg into inhaler and inhale daily.           Past Medical History  Diagnosis Date  . COPD (chronic obstructive pulmonary disease)   . CAD (coronary artery disease)   . CHF (congestive heart failure)     EF of 25%.     ROS:   All systems reviewed and negative except as noted in the HPI.   Past Surgical History  Procedure Date  . Transthoracic  echocardiogram 11/2005  . Status post pacemaker ablation   . Total abdominal hysterectomy   . 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 Esophagus surgery     for acid reflux disease  . Cholecystectomy      Family History  Problem Relation Age of Onset  . Dementia Mother 63  . Ulcers Father 37    perforative  . Heart disease Brother 50  . Heart disease Brother      History   Social History  . Marital Status: Married    Spouse Name: N/A    Number of Children: N/A  . Years of Education: N/A   Occupational History  . retired Other   Social History Main Topics  . Smoking status: Current Everyday Smoker  . Smokeless tobacco: Not on file  . Alcohol Use: No  . Drug Use: No  . Sexually Active: Not on file   Other Topics Concern  . Not on file   Social History Narrative  .  No narrative on file     BP 118/60  Pulse 79  Ht 5\' 5"  (1.651 m)  Wt 59.784 kg (131 lb 12.8 oz)  BMI 21.93 kg/m2  SpO2 97%  Physical Exam:  Well appearing 70 year old woman, NAD HEENT: Unremarkable Neck:  No JVD, no thyromegally Lymphatics:  No adenopathy Back:  No CVA tenderness Lungs:  Clear with scattered rales and decreased breath sounds throughout. No wheezes, rhonchi, or increased work of breathing. Well-healed ICD incision. HEART:  Regular rate rhythm, no murmurs, no rubs, no clicks Abd:  soft, positive bowel sounds, no organomegally, no rebound, no guarding Ext:  2 plus pulses, no edema, no cyanosis, no clubbing Skin:  No rashes no nodules Neuro:  CN II through XII intact, motor grossly intact  DEVICE  Normal device function.  See PaceArt for details.   Assess/Plan:

## 2011-07-18 NOTE — Assessment & Plan Note (Signed)
Her device is working normally. We'll plan to recheck in several months. 

## 2011-07-18 NOTE — Assessment & Plan Note (Signed)
She appears to be maintaining sinus rhythm very nicely. She does have occasional episodes of atrial tachycardia which are minimally if at all symptomatic

## 2011-10-16 ENCOUNTER — Ambulatory Visit (INDEPENDENT_AMBULATORY_CARE_PROVIDER_SITE_OTHER): Payer: Medicare Other | Admitting: *Deleted

## 2011-10-16 ENCOUNTER — Encounter: Payer: Self-pay | Admitting: Internal Medicine

## 2011-10-16 DIAGNOSIS — Z95810 Presence of automatic (implantable) cardiac defibrillator: Secondary | ICD-10-CM

## 2011-10-16 DIAGNOSIS — I428 Other cardiomyopathies: Secondary | ICD-10-CM

## 2011-10-16 LAB — ICD DEVICE OBSERVATION
AL AMPLITUDE: 2.4 mv
AL THRESHOLD: 0.5 V
DEVICE MODEL ICD: 745555
FVT: 0
HV IMPEDENCE: 55 Ohm
MODE SWITCH EPISODES: 67
PACEART VT: 0
RV LEAD THRESHOLD: 0.75 V
TOT-0010: 11
TZAT-0018SLOWVT: NEGATIVE
TZON-0003SLOWVT: 340 ms
TZON-0010SLOWVT: 80 ms
TZST-0001SLOWVT: 2
TZST-0001SLOWVT: 4
VF: 0

## 2011-10-16 NOTE — Progress Notes (Signed)
icd check in clinic  

## 2011-11-13 ENCOUNTER — Other Ambulatory Visit: Payer: Self-pay | Admitting: Internal Medicine

## 2011-11-13 MED ORDER — CARVEDILOL 25 MG PO TABS: 25.0000 mg | ORAL_TABLET | Freq: Two times a day (BID) | ORAL | Status: DC

## 2011-12-29 ENCOUNTER — Encounter: Payer: Self-pay | Admitting: *Deleted

## 2012-01-20 ENCOUNTER — Other Ambulatory Visit: Payer: Self-pay | Admitting: Internal Medicine

## 2012-01-20 ENCOUNTER — Ambulatory Visit (INDEPENDENT_AMBULATORY_CARE_PROVIDER_SITE_OTHER): Payer: Medicare Other | Admitting: Cardiology

## 2012-01-20 ENCOUNTER — Encounter: Payer: Self-pay | Admitting: Cardiology

## 2012-01-20 ENCOUNTER — Encounter: Payer: Self-pay | Admitting: Internal Medicine

## 2012-01-20 VITALS — Ht 67.0 in | Wt 134.0 lb

## 2012-01-20 DIAGNOSIS — Z95810 Presence of automatic (implantable) cardiac defibrillator: Secondary | ICD-10-CM

## 2012-01-20 DIAGNOSIS — I428 Other cardiomyopathies: Secondary | ICD-10-CM

## 2012-01-20 LAB — ICD DEVICE OBSERVATION
AL IMPEDENCE ICD: 390 Ohm
BAMS-0003: 70 {beats}/min
DEVICE MODEL ICD: 745555
HV IMPEDENCE: 55 Ohm
RV LEAD AMPLITUDE: 9.9 mv
RV LEAD IMPEDENCE ICD: 660 Ohm
TZAT-0004SLOWVT: 8
TZAT-0012SLOWVT: 200 ms
TZAT-0013SLOWVT: 2
TZAT-0019SLOWVT: 7.5 V
TZAT-0020SLOWVT: 1 ms
TZON-0003SLOWVT: 340 ms
TZON-0004SLOWVT: 24
TZON-0005SLOWVT: 6
TZST-0001SLOWVT: 3
TZST-0001SLOWVT: 4
TZST-0003SLOWVT: 40 J
VENTRICULAR PACING ICD: 1 pct

## 2012-01-20 NOTE — Progress Notes (Signed)
Patient seen in device clinic only 

## 2012-02-11 ENCOUNTER — Ambulatory Visit (HOSPITAL_COMMUNITY)
Admission: RE | Admit: 2012-02-11 | Discharge: 2012-02-11 | Disposition: A | Discharge status: Home / Self Care | Payer: Medicare Other | Source: Ambulatory Visit | Attending: Internal Medicine | Admitting: Internal Medicine

## 2012-02-11 DIAGNOSIS — R0989 Other specified symptoms and signs involving the circulatory and respiratory systems: Secondary | ICD-10-CM | POA: Insufficient documentation

## 2012-02-11 DIAGNOSIS — F17200 Nicotine dependence, unspecified, uncomplicated: Secondary | ICD-10-CM | POA: Insufficient documentation

## 2012-02-11 DIAGNOSIS — R0609 Other forms of dyspnea: Secondary | ICD-10-CM | POA: Insufficient documentation

## 2012-02-11 DIAGNOSIS — J449 Chronic obstructive pulmonary disease, unspecified: Secondary | ICD-10-CM | POA: Insufficient documentation

## 2012-02-11 DIAGNOSIS — J4489 Other specified chronic obstructive pulmonary disease: Secondary | ICD-10-CM | POA: Insufficient documentation

## 2012-04-17 ENCOUNTER — Encounter: Payer: Self-pay | Admitting: *Deleted

## 2012-04-27 ENCOUNTER — Encounter: Payer: Self-pay | Admitting: Internal Medicine

## 2012-04-27 ENCOUNTER — Ambulatory Visit (INDEPENDENT_AMBULATORY_CARE_PROVIDER_SITE_OTHER): Payer: Medicare Other | Admitting: *Deleted

## 2012-04-27 DIAGNOSIS — I428 Other cardiomyopathies: Secondary | ICD-10-CM

## 2012-04-27 LAB — ICD DEVICE OBSERVATION
AL AMPLITUDE: 3.8 mv
AL IMPEDENCE ICD: 400 Ohm
ATRIAL PACING ICD: 0.09 pct
BAMS-0001: 150 {beats}/min
BAMS-0003: 70 {beats}/min
CHARGE TIME: 9.9 s
DEV-0020ICD: NEGATIVE
FVT: 0
MODE SWITCH EPISODES: 25
RV LEAD AMPLITUDE: 12 mv
RV LEAD THRESHOLD: 1 V
TOT-0008: 0
TZAT-0001SLOWVT: 1
TZAT-0004SLOWVT: 8
TZAT-0012SLOWVT: 200 ms
TZON-0004SLOWVT: 24
TZON-0010SLOWVT: 80 ms
TZST-0001SLOWVT: 3
TZST-0003SLOWVT: 30 J
VENTRICULAR PACING ICD: 0.01 pct
VF: 0

## 2012-04-27 NOTE — Progress Notes (Signed)
defib check in clinic  

## 2012-06-24 ENCOUNTER — Encounter: Payer: Self-pay | Admitting: Cardiology

## 2012-07-15 ENCOUNTER — Encounter: Payer: Self-pay | Admitting: Internal Medicine

## 2012-07-15 ENCOUNTER — Ambulatory Visit (INDEPENDENT_AMBULATORY_CARE_PROVIDER_SITE_OTHER): Payer: Medicare Other | Admitting: Internal Medicine

## 2012-07-15 VITALS — BP 138/82 | HR 79 | Resp 16 | Ht 64.0 in | Wt 132.0 lb

## 2012-07-15 DIAGNOSIS — I428 Other cardiomyopathies: Secondary | ICD-10-CM

## 2012-07-15 DIAGNOSIS — I509 Heart failure, unspecified: Secondary | ICD-10-CM

## 2012-07-15 DIAGNOSIS — Z95810 Presence of automatic (implantable) cardiac defibrillator: Secondary | ICD-10-CM

## 2012-07-15 DIAGNOSIS — I4891 Unspecified atrial fibrillation: Secondary | ICD-10-CM

## 2012-07-15 DIAGNOSIS — J449 Chronic obstructive pulmonary disease, unspecified: Secondary | ICD-10-CM

## 2012-07-15 DIAGNOSIS — I5022 Chronic systolic (congestive) heart failure: Secondary | ICD-10-CM | POA: Insufficient documentation

## 2012-07-15 LAB — ICD DEVICE OBSERVATION
AL AMPLITUDE: 1.8 mv
CHARGE TIME: 9.9 s
DEV-0020ICD: NEGATIVE
DEVICE MODEL ICD: 745555
FVT: 0
HV IMPEDENCE: 54 Ohm
MODE SWITCH EPISODES: 17
RV LEAD AMPLITUDE: 10.1 mv
RV LEAD THRESHOLD: 0.75 V
TOT-0007: 1
TOT-0008: 0
TOT-0010: 13
TZAT-0001SLOWVT: 1
TZAT-0018SLOWVT: NEGATIVE
TZAT-0019SLOWVT: 7.5 V
TZON-0003SLOWVT: 340 ms
TZON-0004SLOWVT: 24
TZON-0010SLOWVT: 80 ms
TZST-0001SLOWVT: 2
TZST-0001SLOWVT: 4
TZST-0003SLOWVT: 30 J
VENTRICULAR PACING ICD: 0 pct
VF: 0

## 2012-07-15 NOTE — Assessment & Plan Note (Signed)
Her [employer] ICD is working normally. We'll plan to recheck in several months. 

## 2012-07-15 NOTE — Patient Instructions (Signed)
Your physician wants you to follow-up in: 3 months with the device clinic and12 months with Dr Taylor You will receive a reminder letter in the mail two months in advance. If you don't receive a letter, please call our office to schedule the follow-up appointment.      

## 2012-07-15 NOTE — Progress Notes (Signed)
HPI [name] returns today for followup.  she is a very pleasant 71 year old woman with a history of coronary disease, chronic systolic heart failure, ventricular tachycardia, status post ICD implantation. In the interim, she has done quite well. Unfortunately she has not been able to stop smoking. She denies chest pain. She has class II dyspnea which is multifactorial both do to COPD as well as congestive heart failure.  Allergies  Allergen Reactions  . Nitroglycerin Er     Blood pressure     Current Outpatient Prescriptions  Medication Sig Dispense Refill  . albuterol (VENTOLIN HFA) 108 (90 BASE) MCG/ACT inhaler Inhale into the lungs every 4 (four) hours as needed.        Marland Kitchen aspirin 81 MG tablet Take 81 mg by mouth daily.        Marland Kitchen atorvastatin (LIPITOR) 20 MG tablet Take 10 mg by mouth daily.       . calcitonin, salmon, (MIACALCIN/FORTICAL) 200 UNIT/ACT nasal spray Place 1 spray into the nose daily.       . carvedilol (COREG) 25 MG tablet Take 1 tablet (25 mg total) by mouth 2 (two) times daily with a meal.  60 tablet  11  . Fluticasone-Salmeterol (ADVAIR) 250-50 MCG/DOSE AEPB Inhale 1 puff into the lungs every 12 (twelve) hours.        Marland Kitchen ipratropium-albuterol (DUONEB) 0.5-2.5 (3) MG/3ML SOLN Take 3 mLs by nebulization 4 (four) times daily.        Marland Kitchen LORazepam (ATIVAN) 0.5 MG tablet Take 0.5 mg by mouth 3 (three) times daily as needed.        Marland Kitchen losartan (COZAAR) 25 MG tablet Take 25 mg by mouth daily.       . mometasone (NASONEX) 50 MCG/ACT nasal spray Place 2 sprays into the nose daily as needed.        Marland Kitchen omeprazole (PRILOSEC) 40 MG capsule Take 40 mg by mouth as needed.        Marland Kitchen spironolactone (ALDACTONE) 25 MG tablet Take 25 mg by mouth every other day.        . tiotropium (SPIRIVA) 18 MCG inhalation capsule Place 18 mcg into inhaler and inhale daily.           Past Medical History  Diagnosis Date  . COPD (chronic obstructive pulmonary disease)   . CAD (coronary artery disease)   .  CHF (congestive heart failure)     EF of 25%.   Marland Kitchen NICM (nonischemic cardiomyopathy)   . Anxiety and depression     ROS:   All systems reviewed and negative except as noted in the HPI.   Past Surgical History  Procedure Date  . Transthoracic echocardiogram 11/2005  . Status post pacemaker ablation   . Total abdominal hysterectomy   . 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 Esophagus surgery     for acid reflux disease  . Cholecystectomy      Family History  Problem Relation Age of Onset  . Dementia Mother 50  . Ulcers Father 37    perforative  . Heart disease Brother 38  . Heart disease Brother      History   Social History  . Marital Status: Married    Spouse Name: N/A    Number of Children: N/A  . Years of Education: N/A   Occupational History  . retired Other   Social History Main Topics  .  Smoking status: Current Every Day Smoker  . Smokeless tobacco: Not on file  . Alcohol Use: No  . Drug Use: No  . Sexually Active: Not on file   Other Topics Concern  . Not on file   Social History Narrative  . No narrative on file     BP 138/82  Pulse 79  Resp 16  Ht 5\' 4"  (1.626 m)  Wt 132 lb (59.875 kg)  BMI 22.66 kg/m2  Physical Exam:   frail butWell appearing 71 year old woman, NAD HEENT: Unremarkable Neck:  No JVD, no thyromegally Lungs:  Clear with no wheezes, rales, or rhonchi. Breath sounds are reduced throughout.  HEART:  Regular rate rhythm, no murmurs, no rubs, no clicks Abd:  soft, positive bowel sounds, no organomegally, no rebound, no guarding Ext:  2 plus pulses, no edema, no cyanosis, no clubbing Skin:  No rashes no nodules Neuro:  CN II through XII intact, motor grossly intact  EKG  normal sinus rhythm prior septal MI  DEVICE  Normal device function.  See PaceArt for details.   Assess/Plan:

## 2012-07-15 NOTE — Assessment & Plan Note (Addendum)
The patient has fairly severe oxygen-dependent COPD. She will continue with her bronchodilators and inhaled steroids. She unfortunately continues to smoke cigarettes. She is down to half pack per day. I've asked her stop smoking altogether.

## 2012-10-14 ENCOUNTER — Other Ambulatory Visit: Payer: Self-pay | Admitting: Internal Medicine

## 2012-10-14 ENCOUNTER — Encounter: Payer: Self-pay | Admitting: Internal Medicine

## 2012-10-14 ENCOUNTER — Ambulatory Visit (INDEPENDENT_AMBULATORY_CARE_PROVIDER_SITE_OTHER): Payer: Medicare PPO | Admitting: *Deleted

## 2012-10-14 DIAGNOSIS — I428 Other cardiomyopathies: Secondary | ICD-10-CM

## 2012-10-14 LAB — ICD DEVICE OBSERVATION
AL AMPLITUDE: 2.9 mv
AL IMPEDENCE ICD: 400 Ohm
DEVICE MODEL ICD: 745555
HV IMPEDENCE: 56 Ohm
TOT-0007: 1
TOT-0008: 0
TOT-0009: 0
TOT-0010: 14
TZAT-0018SLOWVT: NEGATIVE
TZAT-0019SLOWVT: 7.5 V
TZON-0003SLOWVT: 340 ms
TZON-0004SLOWVT: 24
TZON-0010SLOWVT: 80 ms
TZST-0001SLOWVT: 2
TZST-0001SLOWVT: 3
TZST-0003SLOWVT: 40 J
VF: 0

## 2012-10-14 NOTE — Progress Notes (Signed)
ICD check with [employer] 

## 2012-11-02 ENCOUNTER — Other Ambulatory Visit: Payer: Self-pay | Admitting: Internal Medicine

## 2013-01-14 ENCOUNTER — Ambulatory Visit (INDEPENDENT_AMBULATORY_CARE_PROVIDER_SITE_OTHER): Payer: Medicare PPO | Admitting: *Deleted

## 2013-01-14 ENCOUNTER — Encounter: Payer: Self-pay | Admitting: Internal Medicine

## 2013-01-14 DIAGNOSIS — I472 Ventricular tachycardia: Secondary | ICD-10-CM

## 2013-01-14 DIAGNOSIS — I509 Heart failure, unspecified: Secondary | ICD-10-CM

## 2013-01-14 DIAGNOSIS — I428 Other cardiomyopathies: Secondary | ICD-10-CM

## 2013-01-14 LAB — ICD DEVICE OBSERVATION
AL AMPLITUDE: 2.1 mv
AL IMPEDENCE ICD: 412.5 Ohm
BAMS-0001: 150 {beats}/min
BAMS-0003: 70 {beats}/min
RV LEAD THRESHOLD: 0.75 V
TOT-0006: 20101215000000
TOT-0007: 1
TOT-0008: 0
TZAT-0004SLOWVT: 8
TZAT-0012SLOWVT: 200 ms
TZAT-0013SLOWVT: 2
TZAT-0018SLOWVT: NEGATIVE
TZAT-0019SLOWVT: 7.5 V
TZON-0003SLOWVT: 340 ms
TZON-0004SLOWVT: 24
TZST-0001SLOWVT: 3
VENTRICULAR PACING ICD: 0 pct
VF: 0

## 2013-01-14 NOTE — Progress Notes (Signed)
ICD check with [employer] in office. 

## 2013-03-08 ENCOUNTER — Encounter (HOSPITAL_COMMUNITY): Payer: Self-pay

## 2013-03-08 ENCOUNTER — Emergency Department (HOSPITAL_COMMUNITY): Payer: Medicare PPO

## 2013-03-08 ENCOUNTER — Emergency Department (HOSPITAL_COMMUNITY)
Admission: EM | Admit: 2013-03-08 | Discharge: 2013-03-08 | Disposition: A | Discharge status: Home / Self Care | Payer: Medicare PPO | Attending: Emergency Medicine | Admitting: Emergency Medicine

## 2013-03-08 DIAGNOSIS — F17200 Nicotine dependence, unspecified, uncomplicated: Secondary | ICD-10-CM | POA: Insufficient documentation

## 2013-03-08 DIAGNOSIS — R5381 Other malaise: Secondary | ICD-10-CM | POA: Insufficient documentation

## 2013-03-08 DIAGNOSIS — F411 Generalized anxiety disorder: Secondary | ICD-10-CM | POA: Insufficient documentation

## 2013-03-08 DIAGNOSIS — I509 Heart failure, unspecified: Secondary | ICD-10-CM | POA: Insufficient documentation

## 2013-03-08 DIAGNOSIS — J441 Chronic obstructive pulmonary disease with (acute) exacerbation: Secondary | ICD-10-CM | POA: Insufficient documentation

## 2013-03-08 DIAGNOSIS — R5383 Other fatigue: Secondary | ICD-10-CM | POA: Insufficient documentation

## 2013-03-08 DIAGNOSIS — R05 Cough: Secondary | ICD-10-CM | POA: Insufficient documentation

## 2013-03-08 DIAGNOSIS — I2510 Atherosclerotic heart disease of native coronary artery without angina pectoris: Secondary | ICD-10-CM | POA: Insufficient documentation

## 2013-03-08 DIAGNOSIS — Z8679 Personal history of other diseases of the circulatory system: Secondary | ICD-10-CM | POA: Insufficient documentation

## 2013-03-08 DIAGNOSIS — R059 Cough, unspecified: Secondary | ICD-10-CM | POA: Insufficient documentation

## 2013-03-08 DIAGNOSIS — Z79899 Other long term (current) drug therapy: Secondary | ICD-10-CM | POA: Insufficient documentation

## 2013-03-08 DIAGNOSIS — F341 Dysthymic disorder: Secondary | ICD-10-CM | POA: Insufficient documentation

## 2013-03-08 DIAGNOSIS — Z7982 Long term (current) use of aspirin: Secondary | ICD-10-CM | POA: Insufficient documentation

## 2013-03-08 LAB — POCT I-STAT TROPONIN I: Troponin i, poc: 0.01 ng/mL (ref 0.00–0.08)

## 2013-03-08 LAB — CBC
MCH: 33 pg (ref 26.0–34.0)
MCHC: 34.9 g/dL (ref 30.0–36.0)
Platelets: 343 10*3/uL (ref 150–400)

## 2013-03-08 LAB — PRO B NATRIURETIC PEPTIDE: Pro B Natriuretic peptide (BNP): 496.8 pg/mL — ABNORMAL HIGH (ref 0–125)

## 2013-03-08 LAB — BASIC METABOLIC PANEL
Calcium: 9.4 mg/dL (ref 8.4–10.5)
GFR calc non Af Amer: 89 mL/min — ABNORMAL LOW (ref >90)
Sodium: 135 mEq/L (ref 135–145)

## 2013-03-08 MED ORDER — PREDNISONE 50 MG PO TABS: 50.0000 mg | ORAL_TABLET | Freq: Every day | ORAL | Status: DC

## 2013-03-08 MED ORDER — ALBUTEROL SULFATE (5 MG/ML) 0.5% IN NEBU
5.0000 mg | INHALATION_SOLUTION | Freq: Once | RESPIRATORY_TRACT | Status: AC
  Administered 2013-03-08: 5 mg via RESPIRATORY_TRACT
  Filled 2013-03-08: qty 1

## 2013-03-08 MED ORDER — ALBUTEROL (5 MG/ML) CONTINUOUS INHALATION SOLN
10.0000 mg/h | INHALATION_SOLUTION | RESPIRATORY_TRACT | Status: DC
  Administered 2013-03-08: 10 mg/h via RESPIRATORY_TRACT
  Filled 2013-03-08: qty 20

## 2013-03-08 MED ORDER — METHYLPREDNISOLONE SODIUM SUCC 125 MG IJ SOLR
125.0000 mg | Freq: Once | INTRAMUSCULAR | Status: AC
  Administered 2013-03-08: 125 mg via INTRAVENOUS
  Filled 2013-03-08: qty 2

## 2013-03-08 NOTE — ED Provider Notes (Signed)
CSN: [id number]     Arrival date & time 03/08/13  1809 History     First MD Initiated Contact with Patient 03/08/13 1835     Chief Complaint  Patient presents with  . Chest Pain   (Consider location/radiation/quality/duration/timing/severity/associated sxs/prior Treatment) HPI Pt with 1 week of increasing SOB and wheezing with cough productive of clear sputum. Pt c/o chest tightness which has worsened today. She intermittently wear 3 L of home O2 and has been having to wear it more consistently this past week. No lower ext swelling or pain. Pt has had a couple of nebs with some relief of symptoms.  Past Medical History  Diagnosis Date  . COPD (chronic obstructive pulmonary disease)   . CAD (coronary artery disease)   . CHF (congestive heart failure)     EF of 25%.   Marland Kitchen NICM (nonischemic cardiomyopathy)   . Anxiety and depression    Past Surgical History  Procedure Laterality Date  . Transthoracic echocardiogram  11/2005  . Status post pacemaker ablation    . Total abdominal hysterectomy    . 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 Esophagus surgery      for acid reflux disease  . Cholecystectomy     Family History  Problem Relation Age of Onset  . Dementia Mother 61  . Ulcers Father 37    perforative  . Heart disease Brother 16  . Heart disease Brother    History  Substance Use Topics  . Smoking status: Current Every Day Smoker  . Smokeless tobacco: Not on file  . Alcohol Use: No   OB History   Grav Para Term Preterm Abortions TAB SAB Ect Mult Living                 Review of Systems  Constitutional: Positive for fatigue. Negative for fever.  HENT: Negative for neck pain and neck stiffness.   Respiratory: Positive for cough, chest tightness, shortness of breath and wheezing.   Cardiovascular: Negative for chest pain, palpitations and leg swelling.  Gastrointestinal: Negative for nausea, vomiting and  abdominal pain.  Genitourinary: Negative for dysuria.  Musculoskeletal: Negative for myalgias and back pain.  Skin: Negative for rash and wound.  Neurological: Positive for weakness (generalized). Negative for dizziness, light-headedness, numbness and headaches.  All other systems reviewed and are negative.    Allergies  Nitroglycerin er  Home Medications   Current Outpatient Rx  Name  Route  Sig  Dispense  Refill  . albuterol (VENTOLIN HFA) 108 (90 BASE) MCG/ACT inhaler   Inhalation   Inhale into the lungs every 4 (four) hours as needed for shortness of breath.          Marland Kitchen aspirin EC 81 MG tablet   Oral   Take 81 mg by mouth daily.         Marland Kitchen atorvastatin (LIPITOR) 20 MG tablet   Oral   Take 10 mg by mouth daily.         . carvedilol (COREG) 25 MG tablet   Oral   Take 25 mg by mouth 2 (two) times daily with a meal.         . doxycycline (VIBRAMYCIN) 100 MG capsule   Oral   Take 100 mg by mouth 2 (two) times daily.         Marland Kitchen ipratropium-albuterol (DUONEB) 0.5-2.5 (3) MG/3ML SOLN   Nebulization  Take 3 mLs by nebulization 4 (four) times daily.           Marland Kitchen LORazepam (ATIVAN) 0.5 MG tablet   Oral   Take 0.5 mg by mouth 3 (three) times daily as needed.           Marland Kitchen losartan (COZAAR) 25 MG tablet   Oral   Take 25 mg by mouth 2 (two) times daily.          Marland Kitchen omeprazole (PRILOSEC) 40 MG capsule   Oral   Take 40 mg by mouth as needed (for heartburn).          Marland Kitchen spironolactone (ALDACTONE) 25 MG tablet   Oral   Take 25 mg by mouth daily.          Marland Kitchen tiotropium (SPIRIVA) 18 MCG inhalation capsule   Inhalation   Place 18 mcg into inhaler and inhale daily.           . predniSONE (DELTASONE) 50 MG tablet   Oral   Take 1 tablet (50 mg total) by mouth daily.   5 tablet   0    BP 152/86  Pulse 90  Temp(Src) 98.4 F (36.9 C) (Oral)  Resp 23  SpO2 93% Physical Exam  Nursing note and vitals reviewed. Constitutional: She is oriented to person,  place, and time. She appears well-developed and well-nourished. No distress.  HENT:  Head: Normocephalic and atraumatic.  Mouth/Throat: Oropharynx is clear and moist.  Eyes: EOM are normal. Pupils are equal, round, and reactive to light.  Neck: Normal range of motion. Neck supple.  Cardiovascular: Normal rate and regular rhythm.   Pulmonary/Chest: Effort normal. No respiratory distress. She has wheezes (inspiratory and expiratory wheezes throughout). She has no rales. She exhibits no tenderness.  Abdominal: Soft. Bowel sounds are normal. She exhibits no distension and no mass. There is no tenderness. There is no rebound and no guarding.  Musculoskeletal: Normal range of motion. She exhibits no edema and no tenderness.  No calf swelling or pain.   Neurological: She is alert and oriented to person, place, and time.  Moves all ext without deficit   Skin: Skin is warm and dry. No rash noted. No erythema.  Psychiatric: She has a normal mood and affect. Her behavior is normal.    ED Course   Procedures (including critical care time)  Labs Reviewed  CBC - Abnormal; Notable for the following:    WBC 14.7 (*)    Hemoglobin 15.2 (*)    All other components within normal limits  BASIC METABOLIC PANEL - Abnormal; Notable for the following:    Glucose, Bld 142 (*)    GFR calc non Af Amer 89 (*)    All other components within normal limits  PRO B NATRIURETIC PEPTIDE - Abnormal; Notable for the following:    Pro B Natriuretic peptide (BNP) 496.8 (*)    All other components within normal limits  POCT I-STAT TROPONIN I   Dg Chest 2 View  03/08/2013   *RADIOLOGY REPORT*  Clinical Data: Chest pain/pressure today.  Shortness of breath.  CHEST - 2 VIEW  Comparison: Radiographs 05/28/2011.  CT 06/03/2011.  Findings: Left subclavian pacemaker / AICD leads are unchanged within the right atrium and right ventricle.  There is stable volume loss in the left hemithorax with blunting of the costophrenic angle.   Prominent extrapleural fat in the upper left chest is stable.  The right lung is clear.  There is no pleural effusion or  pneumothorax.  IMPRESSION: Stable chest.  No acute cardiopulmonary process.   Original Report Authenticated By: Carey Bullocks, M.D.   1. COPD exacerbation     MDM  Pt feeling close to baseline. Requesting d/c home. Pt given return precautions. VS stable in ED.   Loren Racer, MD 03/08/13 [phone]

## 2013-03-08 NOTE — ED Notes (Signed)
Pt c/o generalized chest pressure, productive cough with thick clear white mucus and more SOB than normal. Pt wears 3 L O2 via [place] at night but reports she has needed it more this last week due to the increase humidity. Pt denies dizziness, weakness, back/neck/arm pain, N/V, or diaphoresis.

## 2013-04-19 ENCOUNTER — Ambulatory Visit (INDEPENDENT_AMBULATORY_CARE_PROVIDER_SITE_OTHER): Payer: Medicare PPO | Admitting: *Deleted

## 2013-04-19 DIAGNOSIS — I509 Heart failure, unspecified: Secondary | ICD-10-CM

## 2013-04-19 DIAGNOSIS — I472 Ventricular tachycardia: Secondary | ICD-10-CM

## 2013-04-19 LAB — ICD DEVICE OBSERVATION
AL AMPLITUDE: 5 mv
AL IMPEDENCE ICD: 400 Ohm
BAMS-0001: 150 {beats}/min
HV IMPEDENCE: 55 Ohm
TOT-0006: 20101215000000
TOT-0007: 1
TOT-0008: 0
TZAT-0004SLOWVT: 8
TZAT-0012SLOWVT: 200 ms
TZAT-0013SLOWVT: 2
TZAT-0018SLOWVT: NEGATIVE
TZAT-0019SLOWVT: 7.5 V
TZON-0003SLOWVT: 340 ms
TZON-0004SLOWVT: 24
TZON-0010SLOWVT: 80 ms
TZST-0001SLOWVT: 3
TZST-0003SLOWVT: 40 J
VENTRICULAR PACING ICD: 0.19 pct
VF: 0

## 2013-04-19 NOTE — Progress Notes (Signed)
ICD check with [employer] in office. 

## 2013-04-27 ENCOUNTER — Encounter: Payer: Self-pay | Admitting: Internal Medicine

## 2013-07-20 ENCOUNTER — Ambulatory Visit (INDEPENDENT_AMBULATORY_CARE_PROVIDER_SITE_OTHER): Payer: Medicare PPO | Admitting: Internal Medicine

## 2013-07-20 ENCOUNTER — Encounter: Payer: Self-pay | Admitting: Internal Medicine

## 2013-07-20 VITALS — BP 120/62 | HR 99 | Ht 66.0 in | Wt 133.8 lb

## 2013-07-20 DIAGNOSIS — Z95810 Presence of automatic (implantable) cardiac defibrillator: Secondary | ICD-10-CM

## 2013-07-20 DIAGNOSIS — I5022 Chronic systolic (congestive) heart failure: Secondary | ICD-10-CM

## 2013-07-20 DIAGNOSIS — I472 Ventricular tachycardia: Secondary | ICD-10-CM

## 2013-07-20 DIAGNOSIS — F17200 Nicotine dependence, unspecified, uncomplicated: Secondary | ICD-10-CM

## 2013-07-20 DIAGNOSIS — I4891 Unspecified atrial fibrillation: Secondary | ICD-10-CM

## 2013-07-20 DIAGNOSIS — I428 Other cardiomyopathies: Secondary | ICD-10-CM

## 2013-07-20 DIAGNOSIS — I509 Heart failure, unspecified: Secondary | ICD-10-CM

## 2013-07-20 MED ORDER — METOPROLOL TARTRATE 50 MG PO TABS: 50.0000 mg | ORAL_TABLET | Freq: Two times a day (BID) | ORAL | Status: DC

## 2013-07-20 NOTE — Assessment & Plan Note (Signed)
She is encouraged to stop smoking

## 2013-07-20 NOTE — Progress Notes (Signed)
HPI [name] returns today for followup.  she is a very pleasant 72 year old woman with a history of coronary disease, chronic systolic heart failure, ventricular tachycardia, COPD, status post ICD implantation. In the interim, she has done quite well. Unfortunately she has not been able to stop smoking. She denies chest pain. She has class II dyspnea which is multifactorial both do to COPD as well as congestive heart failure. She notes that her heart rate is increased. Allergies  Allergen Reactions  . Nitroglycerin Er Other (See Comments)    Blood pressure bottoms out     Current Outpatient Prescriptions  Medication Sig Dispense Refill  . albuterol (VENTOLIN HFA) 108 (90 BASE) MCG/ACT inhaler Inhale into the lungs every 4 (four) hours as needed for shortness of breath.       Marland Kitchen aspirin EC 81 MG tablet Take 81 mg by mouth daily.      . budesonide (PULMICORT) 0.5 MG/2ML nebulizer solution Take 0.5 mg by nebulization 2 (two) times daily.       . carvedilol (COREG) 25 MG tablet Take 25 mg by mouth 2 (two) times daily with a meal.      . DIGOX 250 MCG tablet Take 1 tablet by mouth daily.      Marland Kitchen ipratropium-albuterol (DUONEB) 0.5-2.5 (3) MG/3ML SOLN Take 3 mLs by nebulization 4 (four) times daily.        Marland Kitchen LORazepam (ATIVAN) 0.5 MG tablet Take 0.5 mg by mouth 3 (three) times daily as needed.        Marland Kitchen losartan (COZAAR) 25 MG tablet Take 25 mg by mouth 2 (two) times daily.       . NON FORMULARY Sleeps with 3 L 02      . omeprazole (PRILOSEC) 40 MG capsule Take 40 mg by mouth as needed (for heartburn).       . predniSONE (DELTASONE) 50 MG tablet Take 5 mg by mouth daily.      Marland Kitchen spironolactone (ALDACTONE) 25 MG tablet Take 25 mg by mouth daily.       Marland Kitchen tiotropium (SPIRIVA) 18 MCG inhalation capsule Place 18 mcg into inhaler and inhale daily.         No current facility-administered medications for this visit.     Past Medical History  Diagnosis Date  . COPD (chronic obstructive pulmonary  disease)   . CAD (coronary artery disease)   . CHF (congestive heart failure)     EF of 25%.   Marland Kitchen NICM (nonischemic cardiomyopathy)   . Anxiety and depression     ROS:   All systems reviewed and negative except as noted in the HPI.   Past Surgical History  Procedure Laterality Date  . Transthoracic echocardiogram  11/2005  . Status post pacemaker ablation    . Total abdominal hysterectomy    . 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 Esophagus surgery      for acid reflux disease  . Cholecystectomy       Family History  Problem Relation Age of Onset  . Dementia Mother 69  . Ulcers Father 37    perforative  . Heart disease Brother 59  . Heart disease Brother      History   Social History  . Marital Status: Married    Spouse Name: N/A    Number of Children: N/A  . Years of Education: N/A   Occupational History  .  retired Other   Social History Main Topics  . Smoking status: Current Every Day Smoker  . Smokeless tobacco: Not on file  . Alcohol Use: No  . Drug Use: No  . Sexual Activity: Not on file   Other Topics Concern  . Not on file   Social History Narrative  . No narrative on file     BP 120/62  Pulse 99  Ht 5\' 6"  (1.676 m)  Wt 133 lb 12.8 oz (60.691 kg)  BMI 21.61 kg/m2  Physical Exam:   frail butWell appearing 72 year old woman, NAD HEENT: Unremarkable Neck:  6 cm JVD, no thyromegally Lungs:  Clear with no wheezes, rales, or rhonchi. Breath sounds are reduced throughout.  HEART:  Regular rate rhythm, no murmurs, no rubs, no clicks Abd:  soft, positive bowel sounds, no organomegally, no rebound, no guarding Ext:  2 plus pulses, no edema, no cyanosis, no clubbing Skin:  No rashes no nodules Neuro:  CN II through XII intact, motor grossly intact  DEVICE  Normal device function.  See PaceArt for details.   Assess/Plan:

## 2013-07-20 NOTE — Assessment & Plan Note (Signed)
Her heart failure symptoms remain class II. She does have an elevated resting heart rate, and I've asked the patient to stop taking digoxin and carvedilol, and start taking metoprolol in hopes that her sinus rate will be reduced and the specificity of bete blockade will improve her pulmonary symptoms.

## 2013-07-20 NOTE — Assessment & Plan Note (Signed)
She is maintaining sinus rhythm greater than 99% of the time. She'll continue her current medical therapy except as noted.

## 2013-07-20 NOTE — Assessment & Plan Note (Signed)
Her St. Jude dual-chamber ICD is working normally. We'll plan to recheck in several months. 

## 2013-07-20 NOTE — Patient Instructions (Addendum)
Your physician wants you to follow-up in: 12 months with Dr Court Joy will receive a reminder letter in the mail two months in advance. If you don't receive a letter, please call our office to schedule the follow-up appointment.   Remote monitoring is used to monitor your Pacemaker or ICD from home. This monitoring reduces the number of office visits required to check your device to one time per year. It allows [place] to keep an eye on the functioning of your device to ensure it is working properly. You are scheduled for a device check from home on 10/21/13. You may send your transmission at any time that day. If you have a wireless device, the transmission will be sent automatically. After your physician reviews your transmission, you will receive a postcard with your next transmission date.    Your physician recommends that you return for lab work today: Digoxin level  Your physician has recommended you make the following change in your medication:  1) Stop Carvedilol  2) Start Metoprolol 50mg  twice daily

## 2013-07-21 LAB — MDC_IDC_ENUM_SESS_TYPE_INCLINIC
Battery Remaining Longevity: 67.2 mo
Date Time Interrogation Session: 20141216212224
HighPow Impedance: 58.2154
Implantable Pulse Generator Serial Number: 745555
Lead Channel Impedance Value: 675 Ohm
Lead Channel Pacing Threshold Amplitude: 0.5 V
Lead Channel Pacing Threshold Amplitude: 0.75 V
Lead Channel Pacing Threshold Amplitude: 0.75 V
Lead Channel Pacing Threshold Pulse Width: 0.5 ms
Lead Channel Pacing Threshold Pulse Width: 0.5 ms
Lead Channel Sensing Intrinsic Amplitude: 4.9 mV
Lead Channel Setting Sensing Sensitivity: 0.5 mV
Zone Setting Detection Interval: 340 ms

## 2013-07-26 ENCOUNTER — Telehealth: Payer: Self-pay | Admitting: *Deleted

## 2013-07-26 NOTE — Telephone Encounter (Signed)
D/c per Dr Ladona Ridgel

## 2013-08-23 ENCOUNTER — Telehealth: Payer: Self-pay | Admitting: Cardiology

## 2013-08-23 NOTE — Telephone Encounter (Signed)
Pt called states in December 16 th. 2014 Dr. Ladona Ridgel D/C her Coreg and digoxin medications and was placed on Metoprolol 50 mg twice a day, pt heart rate  continues to run at 110 to 122 beats/minute,  at rest her heart rate 100 beats/minute. Pt still has SOB with mild activity as before.

## 2013-08-23 NOTE — Telephone Encounter (Signed)
Per Dr. Anne Fu pt needs to be seen prior changing pt back  from Metoprolol to Carvedilol and Digoxin medications. An appointment was made with Claudine Mouton PA for 08/25/13 at 1:30 Pm. Pt's husband aware.

## 2013-08-23 NOTE — Telephone Encounter (Signed)
It is likely that she should be back on her carvedilol and digoxin however it would be helpful for her to be seen first prior to reinitiation. Please see if there is availability in [facility] clinic.

## 2013-08-23 NOTE — Telephone Encounter (Signed)
New Prob    Pt states she was recently switched from Coreg to Metoprolol. She says she feels this new medication is ineffective  for her. Requesting to speak to nurse regarding her concerns. Please call.

## 2013-08-25 ENCOUNTER — Encounter: Payer: Self-pay | Admitting: Physician Assistant

## 2013-08-25 ENCOUNTER — Ambulatory Visit (INDEPENDENT_AMBULATORY_CARE_PROVIDER_SITE_OTHER): Payer: Medicare PPO | Admitting: Physician Assistant

## 2013-08-25 ENCOUNTER — Encounter (INDEPENDENT_AMBULATORY_CARE_PROVIDER_SITE_OTHER): Payer: Self-pay

## 2013-08-25 VITALS — BP 120/90 | HR 110 | Ht 66.0 in | Wt 135.0 lb

## 2013-08-25 DIAGNOSIS — J4489 Other specified chronic obstructive pulmonary disease: Secondary | ICD-10-CM

## 2013-08-25 DIAGNOSIS — J449 Chronic obstructive pulmonary disease, unspecified: Secondary | ICD-10-CM

## 2013-08-25 DIAGNOSIS — R000 Tachycardia, unspecified: Secondary | ICD-10-CM | POA: Insufficient documentation

## 2013-08-25 DIAGNOSIS — I4891 Unspecified atrial fibrillation: Secondary | ICD-10-CM

## 2013-08-25 DIAGNOSIS — I509 Heart failure, unspecified: Secondary | ICD-10-CM

## 2013-08-25 DIAGNOSIS — I5022 Chronic systolic (congestive) heart failure: Secondary | ICD-10-CM

## 2013-08-25 MED ORDER — METOPROLOL TARTRATE 50 MG PO TABS: 75.0000 mg | ORAL_TABLET | Freq: Two times a day (BID) | ORAL | Status: DC

## 2013-08-25 MED ORDER — FUROSEMIDE 20 MG PO TABS: 20.0000 mg | ORAL_TABLET | Freq: Every day | ORAL | Status: DC

## 2013-08-25 NOTE — Assessment & Plan Note (Signed)
Patient feels like she needs Lasix. She does not have any Rales or edema but is gaining weight monthly. I will give her low-dose Lasix 20 mg daily to see how she does.

## 2013-08-25 NOTE — Assessment & Plan Note (Signed)
No evidence of atrial fibrillation today

## 2013-08-25 NOTE — Progress Notes (Signed)
HPI: This is a 73 year old female patient of Dr. Sharrell Ku who has a history of coronary artery disease, chronic systolic heart failure, ventricular tachycardia, COPD, status post ICD implantation. She continues to smoke. She has class II dyspnea which is multifactorial both due to to COPD and congestive heart failure. Recently she's had trouble with increased heart rates. Dr. Ladona Ridgel checked her ICD 07/20/2013 and she was maintaining sinus rhythm greater than 99% of the time. Because of her elevated heart rate he asked her to stop taking digoxin and carvedilol and start her on metoprolol in hopes that the sinus rates would be reduced and improve her pulmonary symptoms.  Patient comes in today not feeling any better. She she continues to have rapid heart rates. Last night when she was changing her laundry over her heart rate jumped up to 122 beats per minute. She does admit to not wearing her oxygen at the time. She continues to smoke. She drinks about 2 cups of coffee in the morning and a soda in the afternoon. She also feels like she needs Lasix. She says she continues to gain a few pounds every month and thinks this may be contributing to it. She used to take Lasix.    Allergies-- Nitroglycerin Er -- Other (See Comments)   --  Blood pressure bottoms out  Current Outpatient Prescriptions on File Prior to Visit: albuterol (VENTOLIN HFA) 108 (90 BASE) MCG/ACT inhaler, Inhale into the lungs every 4 (four) hours as needed for shortness of breath. , Disp: , Rfl:  aspirin EC 81 MG tablet, Take 81 mg by mouth daily., Disp: , Rfl:  budesonide (PULMICORT) 0.5 MG/2ML nebulizer solution, Take 0.5 mg by nebulization 2 (two) times daily. , Disp: , Rfl:  ipratropium-albuterol (DUONEB) 0.5-2.5 (3) MG/3ML SOLN, Take 3 mLs by nebulization 4 (four) times daily.  , Disp: , Rfl:  LORazepam (ATIVAN) 0.5 MG tablet, Take 0.5 mg by mouth 3 (three) times daily as needed.  , Disp: , Rfl:  losartan (COZAAR) 25 MG tablet, Take  25 mg by mouth 2 (two) times daily. , Disp: , Rfl:  metoprolol (LOPRESSOR) 50 MG tablet, Take 1 tablet (50 mg total) by mouth 2 (two) times daily., Disp: 180 tablet, Rfl: 3 NON FORMULARY, Sleeps with 3 L 02, Disp: , Rfl:  omeprazole (PRILOSEC) 40 MG capsule, Take 40 mg by mouth as needed (for heartburn). , Disp: , Rfl:  predniSONE (DELTASONE) 50 MG tablet, Take 5 mg by mouth daily., Disp: , Rfl:  spironolactone (ALDACTONE) 25 MG tablet, Take 25 mg by mouth daily. , Disp: , Rfl:  tiotropium (SPIRIVA) 18 MCG inhalation capsule, Place 18 mcg into inhaler and inhale daily.  , Disp: , Rfl:   No current facility-administered medications on file prior to visit.   Past Medical History:   COPD (chronic obstructive pulmonary disease)                 CAD (coronary artery disease)                                CHF (congestive heart failure)                                 Comment:EF of 25%.    NICM (nonischemic cardiomyopathy)  Anxiety and depression                                      Past Surgical History:   TRANSTHORACIC ECHOCARDIOGRAM                     11/2005       Status post pacemaker ablation                                TOTAL ABDOMINAL HYSTERECTOMY                                  SPLENECTOMY                                                     Comment:at the time of the esophageal repair secondary               to  splenic trauma from the rupture   ESOPHAGUS SURGERY                                               Comment:for acid reflux disease   CHOLECYSTECTOMY                                              Review of patient's family history indicates:   Dementia                       Mother                   Ulcers                         Father                     Comment: perforative   Heart disease                  Brother                  Heart disease                  Brother                  Social History   Marital Status: Married              Spouse Name:                      Years of Education:                 Number of children:             Occupational History Occupation          [employer]  Comment              retired             OTHER                 Social History Main Topics   Smoking Status: Current Every Day Smoker        Packs/Day: 0.00  Years:         Smokeless Status: Not on file                      Alcohol Use: No             Drug Use: No             Sexual Activity: Not on file        Other Topics            Concern   None on file  Social History Narrative   None on file    ROS: Chronic dyspnea, shaky, resting tachycardia otherwise see history of present illness   PHYSICAL EXAM: Elderly, in no acute distress. Neck: No JVD, HJR, Bruit, or thyroid enlargement  Lungs: Decreased breath sounds throughout but without wheezing, rales, or rhonchi  Cardiovascular: RRR at 100 beats per minute, PMI not displaced,no murmurs, gallops, bruit, thrill, or heave.  Abdomen: BS normal. Soft without organomegaly, masses, lesions or tenderness.  Extremities: without cyanosis, clubbing or edema. Good distal pulses bilateral  SKin: Warm, no lesions or rashes   Musculoskeletal: No deformities  Neuro: no focal signs  BP 120/90  Pulse 110  Ht 5\' 6"  (1.676 m)  Wt 135 lb (61.236 kg)  BMI 21.80 kg/m2    EKG: Sinus tachycardia at 110 beats per minute with PVC, poor R wave progression

## 2013-08-25 NOTE — Assessment & Plan Note (Signed)
Patient does not have a pulmonologist. She does not raise wear her oxygen which contributes to her tachycardia. She is requesting referral to Dr. Levy Pupa.

## 2013-08-25 NOTE — Patient Instructions (Signed)
Your physician has recommended you make the following change in your medication:   1. Start Lasix once daily 2. Increase Metoprolol to 75 mg twice daily  Your physician recommends that you schedule a follow-up appointment in: Dr. Ladona Ridgel 1-2 weeks.  You have been referred to Dr. Delton Coombes (Pulmonology).

## 2013-08-25 NOTE — Assessment & Plan Note (Signed)
Smoking cessation discussed 

## 2013-08-25 NOTE — Assessment & Plan Note (Signed)
Patient continues to be tachycardiac despite the medication changes. I discussed this patient with Dr. Johney Frame who recommends increasing metoprolol to 75 mg twice a day we'll also refer her to a pulmonologist for her severe COPD. We will have her see Dr. Ladona Ridgel back in one to 2 weeks.

## 2013-08-26 NOTE — Telephone Encounter (Signed)
Per [name] patient is to increase Metoprolol to 75 mg twice daily.

## 2013-09-01 ENCOUNTER — Ambulatory Visit (INDEPENDENT_AMBULATORY_CARE_PROVIDER_SITE_OTHER): Payer: Medicare PPO | Admitting: Emergency Medicine

## 2013-09-01 ENCOUNTER — Encounter: Payer: Self-pay | Admitting: Emergency Medicine

## 2013-09-01 VITALS — BP 118/68 | HR 55 | Ht 65.0 in | Wt 135.0 lb

## 2013-09-01 DIAGNOSIS — J449 Chronic obstructive pulmonary disease, unspecified: Secondary | ICD-10-CM

## 2013-09-01 NOTE — Progress Notes (Signed)
Subjective:    Patient ID: [name], female    DOB: August 23, 1940, 73 y.o.   MRN: [id number]  HPI 73 yo woman, smoker, HTN, CAD, CHF, AICD. Also had LV thrombus in the 90's, anticoagulated at the time but not currently. She also has a hx of resp failure in the past for which she was intubated '09. She is referred for dyspnea, smoking and COPD. She is on Spiriva for 13 yrs, uses Duonebs on a schedule. She uses SABA 0-3x a day. She has O2 that she uses prn.   No wheeze, occas cough. Good exertional tolerance w chores, sometimes rests when cooking, able to shop.   She heated w coal as a youngster, has second hand smoke exposure.     Review of Systems  Constitutional: Negative for fever and unexpected weight change.  HENT: Negative for congestion, dental problem, ear pain, nosebleeds, postnasal drip, rhinorrhea, sinus pressure, sneezing, sore throat and trouble swallowing.   Eyes: Negative for redness and itching.  Respiratory: Positive for cough and shortness of breath. Negative for chest tightness and wheezing.   Cardiovascular: Positive for palpitations. Negative for leg swelling.  Gastrointestinal: Negative for nausea and vomiting.  Genitourinary: Negative for dysuria.  Musculoskeletal: Negative for joint swelling.  Skin: Negative for rash.  Neurological: Negative for headaches.  Hematological: Does not bruise/bleed easily.  Psychiatric/Behavioral: Negative for dysphoric mood. The patient is nervous/anxious.     Past Medical History  Diagnosis Date  . COPD (chronic obstructive pulmonary disease)   . CAD (coronary artery disease)   . CHF (congestive heart failure)     EF of 25%.   Marland Kitchen NICM (nonischemic cardiomyopathy)   . Anxiety and depression   . High blood pressure   . Heart attack   . High cholesterol   . H/O blood clots      Family History  Problem Relation Age of Onset  . Dementia Mother 67  . Ulcers Father 37    perforative  . Heart disease Brother 19  . Heart  disease Brother   . Emphysema Mother      History   Social History  . Marital Status: Married    Spouse Name: N/A    Number of Children: N/A  . Years of Education: N/A   Occupational History  . retired Other   Social History Main Topics  . Smoking status: Current Every Day Smoker -- 0.50 packs/day for 50 years    Types: Cigarettes  . Smokeless tobacco: Not on file  . Alcohol Use: No  . Drug Use: No  . Sexual Activity: Not on file   Other Topics Concern  . Not on file   Social History Narrative  . No narrative on file  Has lived in [place] and [place]  Allergies  Allergen Reactions  . Nitroglycerin Er Other (See Comments)    Blood pressure bottoms out     Outpatient Prescriptions Prior to Visit  Medication Sig Dispense Refill  . albuterol (VENTOLIN HFA) 108 (90 BASE) MCG/ACT inhaler Inhale into the lungs every 4 (four) hours as needed for shortness of breath.       Marland Kitchen aspirin EC 81 MG tablet Take 81 mg by mouth daily.      . furosemide (LASIX) 20 MG tablet Take 1 tablet (20 mg total) by mouth daily.  30 tablet  3  . ipratropium-albuterol (DUONEB) 0.5-2.5 (3) MG/3ML SOLN Take 3 mLs by nebulization 4 (four) times daily.        Marland Kitchen  LORazepam (ATIVAN) 0.5 MG tablet Take 0.5 mg by mouth 3 (three) times daily as needed.        Marland Kitchen. losartan (COZAAR) 25 MG tablet Take 25 mg by mouth 2 (two) times daily.       . metoprolol (LOPRESSOR) 50 MG tablet Take 1.5 tablets (75 mg total) by mouth 2 (two) times daily.  90 tablet  3  . NON FORMULARY Sleeps with 3 L 02      . omeprazole (PRILOSEC) 40 MG capsule Take 40 mg by mouth as needed (for heartburn).       Marland Kitchen. spironolactone (ALDACTONE) 25 MG tablet Take 25 mg by mouth daily.       Marland Kitchen. tiotropium (SPIRIVA) 18 MCG inhalation capsule Place 18 mcg into inhaler and inhale daily.        . predniSONE (DELTASONE) 50 MG tablet Take 5 mg by mouth daily.       No facility-administered medications prior to visit.       Objective:   Physical  Exam Filed Vitals:   09/01/13 1513  BP: 118/68  Pulse: 55  Height: 5\' 5"  (1.651 m)  Weight: 135 lb (61.236 kg)  SpO2: 91%   Gen: Pleasant, thin, in no distress,  normal affect  ENT: No lesions,  mouth clear,  oropharynx clear, no postnasal drip  Neck: No JVD, no TMG, no carotid bruits  Lungs: No use of accessory muscles, distant, clear on normal resp, wheeze on a forced exp  Cardiovascular: RRR, heart sounds normal, no murmur or gallops, no peripheral edema  Musculoskeletal: No deformities, no cyanosis or clubbing  Neuro: alert, non focal  Skin: Warm, no lesions or rash      Assessment & Plan:  COPD - smoking cessation - repeat PFT to compare with priors - hold spiriva  - continue duonebs 4x a day - walking oximetry - rov next avail

## 2013-09-01 NOTE — Assessment & Plan Note (Signed)
-   smoking cessation - repeat PFT to compare with priors - hold spiriva  - continue duonebs 4x a day - walking oximetry - rov next avail

## 2013-09-01 NOTE — Patient Instructions (Signed)
Please stop your spiriva for now Continue your duonebs 4x a day Walking oximetry on room air today We will perform full pulmonary function testing at your next visit Follow with Dr Delton Coombes next available with full PFT

## 2013-09-07 ENCOUNTER — Ambulatory Visit (INDEPENDENT_AMBULATORY_CARE_PROVIDER_SITE_OTHER): Payer: Medicare PPO | Admitting: Internal Medicine

## 2013-09-07 ENCOUNTER — Encounter: Payer: Self-pay | Admitting: Internal Medicine

## 2013-09-07 VITALS — BP 110/70 | HR 106 | Ht 65.0 in | Wt 134.0 lb

## 2013-09-07 DIAGNOSIS — I428 Other cardiomyopathies: Secondary | ICD-10-CM

## 2013-09-07 DIAGNOSIS — F17200 Nicotine dependence, unspecified, uncomplicated: Secondary | ICD-10-CM

## 2013-09-07 DIAGNOSIS — I472 Ventricular tachycardia, unspecified: Secondary | ICD-10-CM

## 2013-09-07 DIAGNOSIS — I4729 Other ventricular tachycardia: Secondary | ICD-10-CM

## 2013-09-07 DIAGNOSIS — I498 Other specified cardiac arrhythmias: Secondary | ICD-10-CM

## 2013-09-07 DIAGNOSIS — I4891 Unspecified atrial fibrillation: Secondary | ICD-10-CM

## 2013-09-07 DIAGNOSIS — I5022 Chronic systolic (congestive) heart failure: Secondary | ICD-10-CM

## 2013-09-07 DIAGNOSIS — I509 Heart failure, unspecified: Secondary | ICD-10-CM

## 2013-09-07 DIAGNOSIS — R000 Tachycardia, unspecified: Secondary | ICD-10-CM

## 2013-09-07 DIAGNOSIS — Z95810 Presence of automatic (implantable) cardiac defibrillator: Secondary | ICD-10-CM

## 2013-09-07 LAB — MDC_IDC_ENUM_SESS_TYPE_INCLINIC
Brady Statistic RA Percent Paced: 0.03 %
Brady Statistic RV Percent Paced: 0.03 %
Date Time Interrogation Session: 20150203124746
HIGH POWER IMPEDANCE MEASURED VALUE: 55.965
Implantable Pulse Generator Serial Number: 745555
Lead Channel Impedance Value: 437.5 Ohm
Lead Channel Impedance Value: 587.5 Ohm
Lead Channel Sensing Intrinsic Amplitude: 2.1 mV
Lead Channel Setting Pacing Amplitude: 2.5 V
Lead Channel Setting Sensing Sensitivity: 0.5 mV
MDC IDC MSMT BATTERY REMAINING LONGEVITY: 63.6 mo
MDC IDC MSMT LEADCHNL RV PACING THRESHOLD AMPLITUDE: 0.75 V
MDC IDC MSMT LEADCHNL RV PACING THRESHOLD PULSEWIDTH: 0.5 ms
MDC IDC MSMT LEADCHNL RV SENSING INTR AMPL: 11.8 mV
MDC IDC SET LEADCHNL RA PACING AMPLITUDE: 2 V
MDC IDC SET LEADCHNL RV PACING PULSEWIDTH: 0.5 ms
MDC IDC SET ZONE DETECTION INTERVAL: 300 ms
MDC IDC SET ZONE DETECTION INTERVAL: 340 ms

## 2013-09-07 NOTE — Assessment & Plan Note (Signed)
Her heart failure symptoms remain class 2. No change in medical therapy.

## 2013-09-07 NOTE — Progress Notes (Signed)
HPI [name] returns today for followup.  she is a very pleasant 73 year old woman with a history of coronary disease, chronic systolic heart failure, ventricular tachycardia, COPD, status post ICD implantation. In the interim, she has done quite well. Unfortunately she has not been able to stop smoking. She denies chest pain. She has class II dyspnea which is multifactorial both do to COPD as well as congestive heart failure. She notes that her heart rate is increased. She uses oxygen only on a prn basis. Minimal productive cough. No palpitations. Allergies  Allergen Reactions  . Nitroglycerin Er Other (See Comments)    Blood pressure bottoms out     Current Outpatient Prescriptions  Medication Sig Dispense Refill  . albuterol (VENTOLIN HFA) 108 (90 BASE) MCG/ACT inhaler Inhale into the lungs every 4 (four) hours as needed for shortness of breath.       Marland Kitchen aspirin EC 81 MG tablet Take 81 mg by mouth daily.      . furosemide (LASIX) 20 MG tablet Take 1 tablet (20 mg total) by mouth daily.  30 tablet  3  . ipratropium-albuterol (DUONEB) 0.5-2.5 (3) MG/3ML SOLN Take 3 mLs by nebulization 4 (four) times daily.        Marland Kitchen LORazepam (ATIVAN) 0.5 MG tablet Take 0.5 mg by mouth 3 (three) times daily as needed.        Marland Kitchen losartan (COZAAR) 25 MG tablet Take 25 mg by mouth 2 (two) times daily.       . metoprolol (LOPRESSOR) 50 MG tablet Take 1.5 tablets (75 mg total) by mouth 2 (two) times daily.  90 tablet  3  . NON FORMULARY Sleeps with 3 L 02      . omeprazole (PRILOSEC) 40 MG capsule Take 40 mg by mouth as needed (for heartburn).       . predniSONE (DELTASONE) 5 MG tablet Take 1 tablet by mouth daily.      Marland Kitchen spironolactone (ALDACTONE) 25 MG tablet Take 25 mg by mouth daily.       Marland Kitchen tiotropium (SPIRIVA) 18 MCG inhalation capsule Place 18 mcg into inhaler and inhale daily.         No current facility-administered medications for this visit.     Past Medical History  Diagnosis Date  . COPD (chronic  obstructive pulmonary disease)   . CAD (coronary artery disease)   . CHF (congestive heart failure)     EF of 25%.   Marland Kitchen NICM (nonischemic cardiomyopathy)   . Anxiety and depression   . High blood pressure   . Heart attack   . High cholesterol   . H/O blood clots     ROS:   All systems reviewed and negative except as noted in the HPI.   Past Surgical History  Procedure Laterality Date  . Transthoracic echocardiogram  11/2005  . Status post pacemaker ablation    . Total abdominal hysterectomy    . 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 Esophagus surgery      for acid reflux disease  . Cholecystectomy    . Cardiac defibrillator placement  2006, 2010     Family History  Problem Relation Age of Onset  . Dementia Mother 81  . Ulcers Father 37    perforative  . Heart disease Brother 36  . Heart disease Brother   . Emphysema Mother      History  Social History  . Marital Status: Married    Spouse Name: N/A    Number of Children: N/A  . Years of Education: N/A   Occupational History  . retired Other   Social History Main Topics  . Smoking status: Current Every Day Smoker -- 0.50 packs/day for 50 years    Types: Cigarettes  . Smokeless tobacco: Not on file  . Alcohol Use: No  . Drug Use: No  . Sexual Activity: Not on file   Other Topics Concern  . Not on file   Social History Narrative  . No narrative on file     BP 110/70  Pulse 106  Ht 5\' 5"  (1.651 m)  Wt 134 lb (60.782 kg)  BMI 22.30 kg/m2  Physical Exam:   frail but well appearing 73 year old woman, NAD HEENT: Unremarkable Neck:  6 cm JVD, no thyromegally Lungs:  Clear with no wheezes, rales, or rhonchi. Breath sounds are reduced throughout.  HEART:  Regular rate rhythm, no murmurs, no rubs, no clicks Abd:  soft, positive bowel sounds, no organomegally, no rebound, no guarding Ext:  2 plus pulses, no edema, no cyanosis, no  clubbing Skin:  No rashes no nodules Neuro:  CN II through XII intact, motor grossly intact  DEVICE  Normal device function.  See PaceArt for details.   Assess/Plan:

## 2013-09-07 NOTE — Patient Instructions (Signed)

## 2013-09-07 NOTE — Assessment & Plan Note (Signed)
She is maintaining NSR. She will continue her current meds.  

## 2013-09-07 NOTE — Assessment & Plan Note (Signed)
Her St. Jude ICD is working normally. Will recheck in several months. 

## 2013-09-07 NOTE — Assessment & Plan Note (Signed)
She continues to smoke and I have encouraged the patient to stop.

## 2013-09-23 ENCOUNTER — Emergency Department (INDEPENDENT_AMBULATORY_CARE_PROVIDER_SITE_OTHER)
Admission: EM | Admit: 2013-09-23 | Discharge: 2013-09-23 | Disposition: A | Discharge status: Home / Self Care | Payer: Commercial Managed Care - HMO | Source: Home / Self Care | Attending: Emergency Medicine | Admitting: Emergency Medicine

## 2013-09-23 ENCOUNTER — Encounter (HOSPITAL_COMMUNITY): Payer: Self-pay | Admitting: Emergency Medicine

## 2013-09-23 DIAGNOSIS — T23019A Burn of unspecified degree of unspecified thumb (nail), initial encounter: Secondary | ICD-10-CM

## 2013-09-23 DIAGNOSIS — T23011A Burn of unspecified degree of right thumb (nail), initial encounter: Secondary | ICD-10-CM

## 2013-09-23 MED ORDER — SILVER SULFADIAZINE 1 % EX CREA: 1.0000 "application " | TOPICAL_CREAM | Freq: Every day | CUTANEOUS | Status: DC

## 2013-09-23 MED ORDER — IBUPROFEN 800 MG PO TABS
ORAL_TABLET | ORAL | Status: AC
  Filled 2013-09-23: qty 1

## 2013-09-23 MED ORDER — SILVER SULFADIAZINE 1 % EX CREA
TOPICAL_CREAM | Freq: Once | CUTANEOUS | Status: AC
  Administered 2013-09-23: 17:00:00 via TOPICAL

## 2013-09-23 MED ORDER — IBUPROFEN 800 MG PO TABS
800.0000 mg | ORAL_TABLET | Freq: Once | ORAL | Status: AC
  Administered 2013-09-23: 800 mg via ORAL

## 2013-09-23 NOTE — ED Notes (Signed)
Report burn to right thumb around 10 a.m removing hot coffee from the microwave.  Pt has used ice for comfort.

## 2013-09-23 NOTE — Discharge Instructions (Signed)

## 2013-09-23 NOTE — ED Provider Notes (Signed)
CSN: [id number]     Arrival date & time 09/23/13  1506 History   First MD Initiated Contact with Patient 09/23/13 1605     Chief Complaint  Patient presents with  . Hand Burn     (Consider location/radiation/quality/duration/timing/severity/associated sxs/prior Treatment) HPI Comments: Patient presents following a burn to her right thumb this morning on hot coffee. Patient states she is here because she does not want area to become infected.  Last tetanus booster: 2014  The history is provided by the patient.    Past Medical History  Diagnosis Date  . COPD (chronic obstructive pulmonary disease)   . CAD (coronary artery disease)   . CHF (congestive heart failure)     EF of 25%.   Marland Kitchen. NICM (nonischemic cardiomyopathy)   . Anxiety and depression   . High blood pressure   . Heart attack   . High cholesterol   . H/O blood clots    Past Surgical History  Procedure Laterality Date  . Transthoracic echocardiogram  11/2005  . Status post pacemaker ablation    . Total abdominal hysterectomy    . 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 Esophagus surgery      for acid reflux disease  . Cholecystectomy    . Cardiac defibrillator placement  2006, 2010   Family History  Problem Relation Age of Onset  . Dementia Mother 6887  . Ulcers Father 37    perforative  . Heart disease Brother 4559  . Heart disease Brother   . Emphysema Mother    History  Substance Use Topics  . Smoking status: Current Every Day Smoker -- 0.50 packs/day for 50 years    Types: Cigarettes  . Smokeless tobacco: Not on file  . Alcohol Use: No   OB History   Grav Para Term Preterm Abortions TAB SAB Ect Mult Living                 Review of Systems      Allergies  Nitroglycerin er  Home Medications   Current Outpatient Rx  Name  Route  Sig  Dispense  Refill  . albuterol (VENTOLIN HFA) 108 (90 BASE) MCG/ACT inhaler   Inhalation    Inhale into the lungs every 4 (four) hours as needed for shortness of breath.          Marland Kitchen. aspirin EC 81 MG tablet   Oral   Take 81 mg by mouth daily.         . furosemide (LASIX) 20 MG tablet   Oral   Take 1 tablet (20 mg total) by mouth daily.   30 tablet   3   . ipratropium-albuterol (DUONEB) 0.5-2.5 (3) MG/3ML SOLN   Nebulization   Take 3 mLs by nebulization 4 (four) times daily.           Marland Kitchen. LORazepam (ATIVAN) 0.5 MG tablet   Oral   Take 0.5 mg by mouth 3 (three) times daily as needed.           Marland Kitchen. losartan (COZAAR) 25 MG tablet   Oral   Take 25 mg by mouth 2 (two) times daily.          . metoprolol (LOPRESSOR) 50 MG tablet   Oral   Take 1.5 tablets (75 mg total) by mouth 2 (two) times daily.   90 tablet   3   . omeprazole (  PRILOSEC) 40 MG capsule   Oral   Take 40 mg by mouth as needed (for heartburn).          . predniSONE (DELTASONE) 5 MG tablet   Oral   Take 1 tablet by mouth daily.         Marland Kitchen spironolactone (ALDACTONE) 25 MG tablet   Oral   Take 25 mg by mouth daily.          Marland Kitchen tiotropium (SPIRIVA) 18 MCG inhalation capsule   Inhalation   Place 18 mcg into inhaler and inhale daily.           . NON FORMULARY      Sleeps with 3 L 02         . silver sulfADIAZINE (SILVADENE) 1 % cream   Topical   Apply 1 application topically daily.   50 g   0    BP 137/84  Pulse 110  Temp(Src) 99.7 F (37.6 C) (Oral)  Resp 20  SpO2 96% Physical Exam  Nursing note and vitals reviewed. Constitutional: She is oriented to person, place, and time. She appears well-developed and well-nourished. No distress.  HENT:  Head: Normocephalic and atraumatic.  Eyes: Conjunctivae are normal.  Cardiovascular: Normal rate.   Pulmonary/Chest: Effort normal.  Musculoskeletal: Normal range of motion.  Neurological: She is oriented to person, place, and time.  Skin: Skin is warm and dry.  +small partial thickness burn to pad of right thumb with intact skin.   Psychiatric: She has a normal mood and affect. Her behavior is normal.    ED Course  Procedures (including critical care time) Labs Review Labs Reviewed - No data to display Imaging Review No results found.    MDM   Final diagnoses:  Burn of right thumb  Applied silvadene dressing to patient's thumb and educated regarding wound care at home.    Jess Barters [place], [place] 09/24/13 [phone]

## 2013-09-25 NOTE — ED Provider Notes (Signed)
Medical screening examination/treatment/procedure(s) were performed by non-physician practitioner and as supervising physician I was immediately available for consultation/collaboration.  Leslee Home, M.D.  Reuben Likes, MD 09/25/13 [phone]

## 2013-10-06 ENCOUNTER — Other Ambulatory Visit: Payer: Self-pay | Admitting: Emergency Medicine

## 2013-10-06 DIAGNOSIS — J449 Chronic obstructive pulmonary disease, unspecified: Secondary | ICD-10-CM

## 2013-10-07 ENCOUNTER — Encounter: Payer: Self-pay | Admitting: Emergency Medicine

## 2013-10-07 ENCOUNTER — Ambulatory Visit (INDEPENDENT_AMBULATORY_CARE_PROVIDER_SITE_OTHER): Payer: Medicare PPO | Admitting: Emergency Medicine

## 2013-10-07 VITALS — BP 120/78 | HR 89 | Ht 65.0 in | Wt 134.0 lb

## 2013-10-07 DIAGNOSIS — J449 Chronic obstructive pulmonary disease, unspecified: Secondary | ICD-10-CM

## 2013-10-07 DIAGNOSIS — F17200 Nicotine dependence, unspecified, uncomplicated: Secondary | ICD-10-CM

## 2013-10-07 DIAGNOSIS — J4489 Other specified chronic obstructive pulmonary disease: Secondary | ICD-10-CM

## 2013-10-07 LAB — PULMONARY FUNCTION TEST
DL/VA % PRED: 87 %
DL/VA: 4.28 ml/min/mmHg/L
DLCO unc % pred: 47 %
DLCO unc: 12.19 ml/min/mmHg
FEF 25-75 PRE: 0.25 L/s
FEF 25-75 Post: 0.36 L/sec
FEF2575-%CHANGE-POST: 44 %
FEF2575-%PRED-PRE: 13 %
FEF2575-%Pred-Post: 20 %
FEV1-%Change-Post: 17 %
FEV1-%PRED-PRE: 23 %
FEV1-%Pred-Post: 27 %
FEV1-Post: 0.63 L
FEV1-Pre: 0.53 L
FEV1FVC-%Change-Post: 6 %
FEV1FVC-%Pred-Pre: 68 %
FEV6-%CHANGE-POST: 11 %
FEV6-%PRED-POST: 39 %
FEV6-%Pred-Pre: 35 %
FEV6-POST: 1.11 L
FEV6-PRE: 1 L
FEV6FVC-%CHANGE-POST: 1 %
FEV6FVC-%PRED-POST: 103 %
FEV6FVC-%Pred-Pre: 101 %
FVC-%Change-Post: 9 %
FVC-%PRED-POST: 38 %
FVC-%PRED-PRE: 34 %
FVC-POST: 1.13 L
FVC-Pre: 1.03 L
POST FEV6/FVC RATIO: 98 %
Post FEV1/FVC ratio: 55 %
Pre FEV1/FVC ratio: 52 %
Pre FEV6/FVC Ratio: 97 %

## 2013-10-07 MED ORDER — AEROCHAMBER MV MISC: Status: AC

## 2013-10-07 NOTE — Progress Notes (Signed)
Subjective:    Patient ID: [name], female    DOB: 05/01/1941, 73 y.o.   MRN: [id number]  HPI 73 yo woman, smoker, HTN, CAD, CHF, AICD. Also had LV thrombus in the 90's, anticoagulated at the time but not currently. She also has a hx of resp failure in the past for which she was intubated '09. She is referred for dyspnea, smoking and COPD. She is on Spiriva for 13 yrs, uses Duonebs on a schedule. She uses SABA 0-3x a day. She has O2 that she uses prn.   No wheeze, occas cough. Good exertional tolerance w chores, sometimes rests when cooking, able to shop.   She heated w coal as a youngster, has second hand smoke exposure.    ROV 10/07/13 -- follows up for tobacco and COPD. 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 Last time we continued duonebs, stopped spiriva. She doesn't miss it. She is using SABA occasionally. She does worse in the warm humid weather. She has some cough and wheeze but these are stable.    Review of Systems  Constitutional: Negative for fever and unexpected weight change.  HENT: Negative for congestion, dental problem, ear pain, nosebleeds, postnasal drip, rhinorrhea, sinus pressure, sneezing, sore throat and trouble swallowing.   Eyes: Negative for redness and itching.  Respiratory: Positive for cough and shortness of breath. Negative for chest tightness and wheezing.   Cardiovascular: Positive for palpitations. Negative for leg swelling.  Gastrointestinal: Negative for nausea and vomiting.  Genitourinary: Negative for dysuria.  Musculoskeletal: Negative for joint swelling.  Skin: Negative for rash.  Neurological: Negative for headaches.  Hematological: Does not bruise/bleed easily.  Psychiatric/Behavioral: Negative for dysphoric mood. The patient is nervous/anxious.     Past Medical History  Diagnosis Date  . COPD (chronic obstructive pulmonary disease)   . CAD (coronary artery disease)   . CHF (congestive heart failure)     EF of  25%.   Marland Kitchen. NICM (nonischemic cardiomyopathy)   . Anxiety and depression   . High blood pressure   . Heart attack   . High cholesterol   . H/O blood clots      Family History  Problem Relation Age of Onset  . Dementia Mother 4687  . Ulcers Father 37    perforative  . Heart disease Brother 8359  . Heart disease Brother   . Emphysema Mother      History   Social History  . Marital Status: Married    Spouse Name: N/A    Number of Children: N/A  . Years of Education: N/A   Occupational History  . retired Other   Social History Main Topics  . Smoking status: Current Every Day Smoker -- 0.50 packs/day for 50 years    Types: Cigarettes  . Smokeless tobacco: Not on file  . Alcohol Use: No  . Drug Use: No  . Sexual Activity: Not Currently   Other Topics Concern  . Not on file   Social History Narrative  . No narrative on file  Has lived in [place] and [place]  Allergies  Allergen Reactions  . Nitroglycerin Er Other (See Comments)    Blood pressure bottoms out     Outpatient Prescriptions Prior to Visit  Medication Sig Dispense Refill  . albuterol (VENTOLIN HFA) 108 (90 BASE) MCG/ACT inhaler Inhale into the lungs every 4 (four) hours as needed for shortness of breath.       Marland Kitchen. aspirin EC 81 MG tablet  Take 81 mg by mouth daily.      . furosemide (LASIX) 20 MG tablet Take 1 tablet (20 mg total) by mouth daily.  30 tablet  3  . ipratropium-albuterol (DUONEB) 0.5-2.5 (3) MG/3ML SOLN Take 3 mLs by nebulization 4 (four) times daily.        Marland Kitchen LORazepam (ATIVAN) 0.5 MG tablet Take 0.5 mg by mouth 3 (three) times daily as needed.        Marland Kitchen losartan (COZAAR) 25 MG tablet Take 25 mg by mouth 2 (two) times daily.       . metoprolol (LOPRESSOR) 50 MG tablet Take 1.5 tablets (75 mg total) by mouth 2 (two) times daily.  90 tablet  3  . NON FORMULARY Sleeps with 3 L 02      . omeprazole (PRILOSEC) 40 MG capsule Take 40 mg by mouth as needed (for heartburn).       . predniSONE (DELTASONE) 5 MG  tablet Take 1 tablet by mouth daily.      Marland Kitchen spironolactone (ALDACTONE) 25 MG tablet Take 25 mg by mouth daily.       . silver sulfADIAZINE (SILVADENE) 1 % cream Apply 1 application topically daily.  50 g  0  . tiotropium (SPIRIVA) 18 MCG inhalation capsule Place 18 mcg into inhaler and inhale daily.         No facility-administered medications prior to visit.       Objective:   Physical Exam Filed Vitals:   10/07/13 1327  BP: 120/78  Pulse: 89  Height: 5\' 5"  (1.651 m)  Weight: 134 lb (60.782 kg)  SpO2: 93%   Gen: Pleasant, thin, in no distress,  normal affect  ENT: No lesions,  mouth clear,  oropharynx clear, no postnasal drip  Neck: No JVD, no TMG, no carotid bruits  Lungs: No use of accessory muscles, distant, clear on normal resp, wheeze on a forced exp  Cardiovascular: RRR, heart sounds normal, no murmur or gallops, no peripheral edema  Musculoskeletal: No deformities, no cyanosis or clubbing  Neuro: alert, non focal  Skin: Warm, no lesions or rash      Assessment & Plan:  TOBACCO ABUSE Discussed cessation today   COPD Very severe AFL, GOLD C disease.  - continue duonebs, do not restart spiriva - SABA prn - rov 4

## 2013-10-07 NOTE — Patient Instructions (Signed)
Please continue your DuoNebs 4 times a day Have your albuterol available to use as needed Do not restart the Spiriva Wear your oxygen with heavy exertion.  Follow with Dr Delton Coombes in 4 months or sooner if you have any problems.

## 2013-10-07 NOTE — Assessment & Plan Note (Signed)
Very severe AFL, GOLD C disease.  - continue duonebs, do not restart spiriva - SABA prn - rov 4

## 2013-10-07 NOTE — Addendum Note (Signed)
Addended by: Gwynneth Aliment A on: 10/07/2013 02:03 PM   Modules accepted: Orders

## 2013-10-07 NOTE — Progress Notes (Signed)
PFT done today. 

## 2013-10-07 NOTE — Assessment & Plan Note (Signed)
Discussed cessation today. 

## 2013-10-14 ENCOUNTER — Ambulatory Visit: Payer: Medicare PPO | Admitting: Cardiology

## 2013-11-02 ENCOUNTER — Encounter: Payer: Self-pay | Admitting: Cardiology

## 2013-11-02 ENCOUNTER — Ambulatory Visit (INDEPENDENT_AMBULATORY_CARE_PROVIDER_SITE_OTHER): Payer: Commercial Managed Care - HMO | Admitting: Cardiology

## 2013-11-02 VITALS — BP 118/64 | HR 100 | Ht 65.0 in | Wt 135.0 lb

## 2013-11-02 DIAGNOSIS — I4891 Unspecified atrial fibrillation: Secondary | ICD-10-CM

## 2013-11-02 DIAGNOSIS — Z95810 Presence of automatic (implantable) cardiac defibrillator: Secondary | ICD-10-CM

## 2013-11-02 DIAGNOSIS — I5022 Chronic systolic (congestive) heart failure: Secondary | ICD-10-CM

## 2013-11-02 DIAGNOSIS — F17200 Nicotine dependence, unspecified, uncomplicated: Secondary | ICD-10-CM

## 2013-11-02 DIAGNOSIS — I471 Supraventricular tachycardia: Secondary | ICD-10-CM

## 2013-11-02 DIAGNOSIS — I428 Other cardiomyopathies: Secondary | ICD-10-CM

## 2013-11-02 MED ORDER — METOPROLOL TARTRATE 50 MG PO TABS: 100.0000 mg | ORAL_TABLET | Freq: Two times a day (BID) | ORAL | Status: DC

## 2013-11-02 NOTE — Patient Instructions (Addendum)
Your physician has recommended you make the following change in your medication:   1. Increase Metoprolol to 100 mg twice daily  Your physician wants you to follow-up in: 6 months follow-up with Dr. Anne Fu. You will receive a reminder letter in the mail two months in advance. If you don't receive a letter, please call our office to schedule the follow-up appointment.

## 2013-11-02 NOTE — Progress Notes (Signed)
[address] [place], [place]  [address] Phone: [phone] Fax:  [phone]  Date:  11/02/2013   ID:  Lindsey CaulGretchen Cafaro, DOB 06/23/1941, MRN [id number]  PCP:  Georgann HousekeeperHUSAIN,KARRAR, MD   History of Present Illness: [name] is a 73 y.o. female with chronic systolic heart failure, CAD, ventricular tachycardia, COPD post ICD implantation, smoker here for followup. Her heart failure symptoms remain in class II. Atrial fibrillation, maintaining sinus rhythm on current medications and her ICD is working properly.  Recently has been having difficulty maintaining her heart rate. It is apparently 100. No chest pain. No significant short of breath.   Wt Readings from Last 3 Encounters:  11/02/13 135 lb (61.236 kg)  10/07/13 134 lb (60.782 kg)  09/07/13 134 lb (60.782 kg)     Past Medical History  Diagnosis Date  . COPD (chronic obstructive pulmonary disease)   . CAD (coronary artery disease)   . CHF (congestive heart failure)     EF of 25%.   Marland Kitchen. NICM (nonischemic cardiomyopathy)   . Anxiety and depression   . High blood pressure   . Heart attack   . High cholesterol   . H/O blood clots     Past Surgical History  Procedure Laterality Date  . Transthoracic echocardiogram  11/2005  . Status post pacemaker ablation    . Total abdominal hysterectomy    . 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 Esophagus surgery      for acid reflux disease  . Cholecystectomy    . Cardiac defibrillator placement  2006, 2010    Current Outpatient Prescriptions  Medication Sig Dispense Refill  . albuterol (VENTOLIN HFA) 108 (90 BASE) MCG/ACT inhaler Inhale into the lungs every 4 (four) hours as needed for shortness of breath.       Marland Kitchen. aspirin EC 81 MG tablet Take 81 mg by mouth daily.      . furosemide (LASIX) 20 MG tablet Take 1 tablet (20 mg total) by mouth daily.  30 tablet  3  . ipratropium-albuterol (DUONEB) 0.5-2.5  (3) MG/3ML SOLN Take 3 mLs by nebulization 4 (four) times daily.        Marland Kitchen. LORazepam (ATIVAN) 0.5 MG tablet Take 0.5 mg by mouth 3 (three) times daily as needed.        Marland Kitchen. losartan (COZAAR) 25 MG tablet Take 25 mg by mouth 2 (two) times daily.       . metoprolol (LOPRESSOR) 50 MG tablet Take 1.5 tablets (75 mg total) by mouth 2 (two) times daily.  90 tablet  3  . NON FORMULARY Sleeps with 3 L 02      . omeprazole (PRILOSEC) 40 MG capsule Take 40 mg by mouth as needed (for heartburn).       . predniSONE (DELTASONE) 5 MG tablet Take 1 tablet by mouth daily.      Marland Kitchen. Spacer/Aero-Holding Chambers (AEROCHAMBER MV) inhaler Use as instructed  1 each  0  . spironolactone (ALDACTONE) 25 MG tablet Take 25 mg by mouth daily.        No current facility-administered medications for this visit.    Allergies:    Allergies  Allergen Reactions  . Nitroglycerin Er Other (See Comments)    Blood pressure bottoms out    Social History:  The patient  reports that she has been smoking Cigarettes.  She has  a 25 pack-year smoking history. She does not have any smokeless tobacco history on file. She reports that she does not drink alcohol or use illicit drugs.   ROS:  Please see the history of present illness.   Denies any syncope, orthopnea, PND, chest pain.  PHYSICAL EXAM: VS:  BP 118/64  Pulse 100  Ht 5\' 5"  (1.651 m)  Wt 135 lb (61.236 kg)  BMI 22.47 kg/m2 Well nourished, well developed, in no acute distress HEENT: normal Neck: no JVD Cardiac:  normal S1, S2; mildly tachycardic/RRR; no murmur Lungs:  clear to auscultation bilaterally, no wheezing, rhonchi or ralesSomewhat distant breath sounds from COPD Abd: soft, nontender, no hepatomegaly Ext: no edema Skin: warm and dry Neuro: no focal abnormalities noted  EKG:  Pacemaker interrogation reviewed     ASSESSMENT AND PLAN:  1. Tachycardia-I will increase her metoprolol to 100 mg twice a day from 75 mg twice a day. 2. Atrial fibrillation-maintaining  sinus rhythm. 3. Coronary artery disease-no active anginal symptoms. 4. Tobacco use-she is going to work with Dr. Delton Coombes on tobacco cessation. 5. COPD-severe. Likely limiting her symptoms as well. 6. Chronic systolic heart failure-uptitrating beta blocker to help reduce heart rate.  Signed, Donato Schultz, MD [facility]  11/02/2013 1:44 PM

## 2013-11-11 ENCOUNTER — Ambulatory Visit (INDEPENDENT_AMBULATORY_CARE_PROVIDER_SITE_OTHER): Payer: Medicare PPO | Admitting: Emergency Medicine

## 2013-11-11 ENCOUNTER — Encounter: Payer: Self-pay | Admitting: Emergency Medicine

## 2013-11-11 VITALS — BP 98/60 | HR 93 | Temp 97.7°F | Ht 65.0 in | Wt 135.4 lb

## 2013-11-11 DIAGNOSIS — J449 Chronic obstructive pulmonary disease, unspecified: Secondary | ICD-10-CM

## 2013-11-11 DIAGNOSIS — J309 Allergic rhinitis, unspecified: Secondary | ICD-10-CM

## 2013-11-11 MED ORDER — LORATADINE 10 MG PO TABS: 10.0000 mg | ORAL_TABLET | Freq: Every day | ORAL | Status: AC

## 2013-11-11 MED ORDER — FLUTICASONE PROPIONATE 50 MCG/ACT NA SUSP: 2.0000 | Freq: Two times a day (BID) | NASAL | Status: AC

## 2013-11-11 NOTE — Patient Instructions (Signed)
Please continue your nebulized medications as you are taking them Please start loratadine 10mg  daily Start nasonex 2 sprays each side daily. When this is gone, fill prescription for fluticasone nasal spray and take 2 sprays each nostril twice a day Continue your metoprolol 100mg  twice a day for now. If you continue to have problems we may decide to discuss the dosing with Dr Anne Fu.  Follow with Dr Delton Coombes in 3 months or sooner if you have any problems.

## 2013-11-11 NOTE — Assessment & Plan Note (Signed)
Start fluticasone and loratadine

## 2013-11-11 NOTE — Assessment & Plan Note (Signed)
Suspect her control is lower due to allergy season, but consider the increase in her metoprolol as a less likely possibility  Please continue your nebulized medications as you are taking them Please start loratadine 10mg  daily Start nasonex 2 sprays each side daily. When this is gone, fill prescription for fluticasone nasal spray and take 2 sprays each nostril twice a day Continue your metoprolol 100mg  twice a day for now. If you continue to have problems we may decide to discuss the dosing with Dr Anne Fu.  Follow with Dr Delton Coombes in 3 months or sooner if you have any problems

## 2013-11-11 NOTE — Progress Notes (Signed)
Subjective:    Patient ID: [name], female    DOB: 05/13/1941, 73 y.o.   MRN: [id number]  HPI 73 yo woman, smoker, HTN, CAD, CHF, AICD. Also had LV thrombus in the 90's, anticoagulated at the time but not currently. She also has a hx of resp failure in the past for which she was intubated '09. She is referred for dyspnea, smoking and COPD. She is on Spiriva for 13 yrs, uses Duonebs on a schedule. She uses SABA 0-3x a day. She has O2 that she uses prn.   No wheeze, occas cough. Good exertional tolerance w chores, sometimes rests when cooking, able to shop.   She heated w coal as a youngster, has second hand smoke exposure.    ROV 10/07/13 -- follows up for tobacco and COPD. 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 Last time we continued duonebs, stopped spiriva. She doesn't miss it. She is using SABA occasionally. She does worse in the warm humid weather. She has some cough and wheeze but these are stable.   ROV 11/11/13 -- follows for COPD, severe AFL. Continues to smoke. She also has hx CAD, AICD for VT, CHF.  Her metoprolol was increased to 100 bid on 3/31.  For last 4-5 days nasal congestion, dry cough, some wheeze at night when she lays down.  She is on Pred 5 qd for 4-5 months, duonebs qid.    Review of Systems  Constitutional: Negative for fever and unexpected weight change.  HENT: Negative for congestion, dental problem, ear pain, nosebleeds, postnasal drip, rhinorrhea, sinus pressure, sneezing, sore throat and trouble swallowing.   Eyes: Negative for redness and itching.  Respiratory: Positive for cough and shortness of breath. Negative for chest tightness and wheezing.   Cardiovascular: Positive for palpitations. Negative for leg swelling.  Gastrointestinal: Negative for nausea and vomiting.  Genitourinary: Negative for dysuria.  Musculoskeletal: Negative for joint swelling.  Skin: Negative for rash.  Neurological: Negative for headaches.   Hematological: Does not bruise/bleed easily.  Psychiatric/Behavioral: Negative for dysphoric mood. The patient is nervous/anxious.        Objective:   Physical Exam Filed Vitals:   11/11/13 1414  BP: 98/60  Pulse: 93  Temp: 97.7 F (36.5 C)  TempSrc: Oral  Height: 5\' 5"  (1.651 m)  Weight: 135 lb 6.4 oz (61.417 kg)  SpO2: 95%   Gen: Pleasant, thin, in no distress,  normal affect  ENT: No lesions,  mouth clear,  oropharynx clear, no postnasal drip  Neck: No JVD, no TMG, no carotid bruits  Lungs: No use of accessory muscles, distant, clear on normal resp, wheeze on a forced exp  Cardiovascular: RRR, heart sounds normal, no murmur or gallops, no peripheral edema  Musculoskeletal: No deformities, no cyanosis or clubbing  Neuro: alert, non focal  Skin: Warm, no lesions or rash      Assessment & Plan:  COPD Suspect her control is lower due to allergy season, but consider the increase in her metoprolol as a less likely possibility  Please continue your nebulized medications as you are taking them Please start loratadine 10mg  daily Start nasonex 2 sprays each side daily. When this is gone, fill prescription for fluticasone nasal spray and take 2 sprays each nostril twice a day Continue your metoprolol 100mg  twice a day for now. If you continue to have problems we may decide to discuss the dosing with Dr Anne FuSkains.  Follow with Dr Delton CoombesByrum in 3 months or sooner  if you have any problems  Allergic rhinitis Start fluticasone and loratadine

## 2013-11-18 ENCOUNTER — Ambulatory Visit (INDEPENDENT_AMBULATORY_CARE_PROVIDER_SITE_OTHER): Payer: Medicare PPO | Admitting: *Deleted

## 2013-11-18 DIAGNOSIS — I472 Ventricular tachycardia: Secondary | ICD-10-CM

## 2013-11-18 DIAGNOSIS — I5022 Chronic systolic (congestive) heart failure: Secondary | ICD-10-CM

## 2013-11-18 DIAGNOSIS — I428 Other cardiomyopathies: Secondary | ICD-10-CM

## 2013-11-18 DIAGNOSIS — I4729 Other ventricular tachycardia: Secondary | ICD-10-CM

## 2013-11-18 DIAGNOSIS — I509 Heart failure, unspecified: Secondary | ICD-10-CM

## 2013-11-18 LAB — MDC_IDC_ENUM_SESS_TYPE_INCLINIC
Battery Remaining Longevity: 62.4 mo
Brady Statistic RV Percent Paced: 0 %
HighPow Impedance: 57 Ohm
HighPow Impedance: 57.3714
Implantable Pulse Generator Serial Number: 745555
Lead Channel Impedance Value: 562.5 Ohm
Lead Channel Pacing Threshold Amplitude: 0.5 V
Lead Channel Pacing Threshold Amplitude: 0.5 V
Lead Channel Pacing Threshold Pulse Width: 0.5 ms
Lead Channel Pacing Threshold Pulse Width: 0.5 ms
Lead Channel Pacing Threshold Pulse Width: 0.5 ms
Lead Channel Sensing Intrinsic Amplitude: 4 mV
Lead Channel Setting Pacing Amplitude: 2 V
Lead Channel Setting Pacing Amplitude: 2.5 V
Lead Channel Setting Pacing Pulse Width: 0.5 ms
Lead Channel Setting Sensing Sensitivity: 0.5 mV
MDC IDC MSMT LEADCHNL RA IMPEDANCE VALUE: 412.5 Ohm
MDC IDC MSMT LEADCHNL RV PACING THRESHOLD AMPLITUDE: 0.75 V
MDC IDC MSMT LEADCHNL RV PACING THRESHOLD AMPLITUDE: 0.75 V
MDC IDC MSMT LEADCHNL RV PACING THRESHOLD PULSEWIDTH: 0.5 ms
MDC IDC MSMT LEADCHNL RV SENSING INTR AMPL: 11.4 mV
MDC IDC SESS DTM: 20150416145450
MDC IDC SET ZONE DETECTION INTERVAL: 340 ms
MDC IDC STAT BRADY RA PERCENT PACED: 0 %
Zone Setting Detection Interval: 300 ms

## 2013-11-18 NOTE — Progress Notes (Signed)
ICD check in clinic. Normal device function. Thresholds and sensing consistent with previous device measurements. Impedance trends stable over time. No evidence of any ventricular arrhythmias. 99 mode switches <1%, the longest 58 seconds, - coumadin. Histogram distribution appropriate for patient and level of activity.  [place] normal.   No changes made this session. Device programmed at appropriate safety margins. Device programmed to optimize intrinsic conduction. Estimated longevity 5 years. Patient education completed including shock plan. Alert tones/vibration demonstrated for patient.  ROV 3 months with the device clinic.

## 2013-12-10 ENCOUNTER — Encounter: Payer: Self-pay | Admitting: Internal Medicine

## 2013-12-30 ENCOUNTER — Other Ambulatory Visit: Payer: Self-pay | Admitting: *Deleted

## 2013-12-30 MED ORDER — METOPROLOL TARTRATE 50 MG PO TABS: 100.0000 mg | ORAL_TABLET | Freq: Two times a day (BID) | ORAL | Status: DC

## 2014-01-27 ENCOUNTER — Ambulatory Visit
Admission: RE | Admit: 2014-01-27 | Discharge: 2014-01-27 | Disposition: A | Discharge status: Home / Self Care | Payer: Commercial Managed Care - HMO | Source: Ambulatory Visit | Attending: Internal Medicine | Admitting: Internal Medicine

## 2014-01-27 ENCOUNTER — Other Ambulatory Visit: Payer: Self-pay | Admitting: Internal Medicine

## 2014-01-27 DIAGNOSIS — M5412 Radiculopathy, cervical region: Secondary | ICD-10-CM

## 2014-02-16 ENCOUNTER — Other Ambulatory Visit: Payer: Self-pay | Admitting: Internal Medicine

## 2014-02-16 DIAGNOSIS — M5412 Radiculopathy, cervical region: Secondary | ICD-10-CM

## 2014-02-17 ENCOUNTER — Ambulatory Visit (INDEPENDENT_AMBULATORY_CARE_PROVIDER_SITE_OTHER): Payer: Commercial Managed Care - HMO | Admitting: *Deleted

## 2014-02-17 DIAGNOSIS — R000 Tachycardia, unspecified: Secondary | ICD-10-CM

## 2014-02-17 DIAGNOSIS — I498 Other specified cardiac arrhythmias: Secondary | ICD-10-CM

## 2014-02-17 DIAGNOSIS — I5022 Chronic systolic (congestive) heart failure: Secondary | ICD-10-CM

## 2014-02-17 DIAGNOSIS — I428 Other cardiomyopathies: Secondary | ICD-10-CM

## 2014-02-17 DIAGNOSIS — I509 Heart failure, unspecified: Secondary | ICD-10-CM

## 2014-02-17 DIAGNOSIS — Z95810 Presence of automatic (implantable) cardiac defibrillator: Secondary | ICD-10-CM

## 2014-02-17 LAB — MDC_IDC_ENUM_SESS_TYPE_INCLINIC
Brady Statistic RA Percent Paced: 0.01 %
HIGH POWER IMPEDANCE MEASURED VALUE: 53 Ohm
Lead Channel Impedance Value: 450 Ohm
Lead Channel Pacing Threshold Amplitude: 0.5 V
Lead Channel Pacing Threshold Amplitude: 0.75 V
Lead Channel Pacing Threshold Amplitude: 0.75 V
Lead Channel Pacing Threshold Pulse Width: 0.5 ms
Lead Channel Pacing Threshold Pulse Width: 0.5 ms
Lead Channel Sensing Intrinsic Amplitude: 12 mV
Lead Channel Setting Pacing Amplitude: 2 V
Lead Channel Setting Sensing Sensitivity: 0.5 mV
MDC IDC MSMT BATTERY REMAINING LONGEVITY: 61.2 mo
MDC IDC MSMT LEADCHNL RA PACING THRESHOLD AMPLITUDE: 0.5 V
MDC IDC MSMT LEADCHNL RA PACING THRESHOLD PULSEWIDTH: 0.5 ms
MDC IDC MSMT LEADCHNL RA SENSING INTR AMPL: 5 mV
MDC IDC MSMT LEADCHNL RV IMPEDANCE VALUE: 587.5 Ohm
MDC IDC MSMT LEADCHNL RV PACING THRESHOLD PULSEWIDTH: 0.5 ms
MDC IDC PG SERIAL: 745555
MDC IDC SESS DTM: 20150716144244
MDC IDC SET LEADCHNL RV PACING AMPLITUDE: 2.5 V
MDC IDC SET LEADCHNL RV PACING PULSEWIDTH: 0.5 ms
MDC IDC STAT BRADY RV PERCENT PACED: 0 %
Zone Setting Detection Interval: 300 ms
Zone Setting Detection Interval: 340 ms

## 2014-02-17 NOTE — Progress Notes (Signed)
ICD check in clinic. Normal device function. Thresholds and sensing consistent with previous device measurements. Impedance trends stable over time. No evidence of any ventricular arrhythmias. 41 mode switches--- <1%, longest 12sec. Histogram distribution appropriate for patient and level of activity. No changes made this session. Device programmed at appropriate safety margins. Device programmed to optimize intrinsic conduction. Estimated longevity 4.8-5.57yrs. ROV w/ device clinic 05/19/14 & w/ Dr. Ladona Ridgel 09/2014.

## 2014-02-21 ENCOUNTER — Ambulatory Visit
Admission: RE | Admit: 2014-02-21 | Discharge: 2014-02-21 | Disposition: A | Discharge status: Home / Self Care | Payer: Commercial Managed Care - HMO | Source: Ambulatory Visit | Attending: Internal Medicine | Admitting: Internal Medicine

## 2014-02-21 DIAGNOSIS — M5412 Radiculopathy, cervical region: Secondary | ICD-10-CM

## 2014-02-22 ENCOUNTER — Telehealth: Payer: Self-pay | Admitting: Cardiology

## 2014-02-22 NOTE — Telephone Encounter (Signed)
New problem   Pt is having low blood pressure and want to speak to nurse. Please call pt

## 2014-02-22 NOTE — Telephone Encounter (Signed)
Spoke with pt who reports having a low BP (around 90/64) for some time now with a HR about 102 bpm.  Her PCP per her report stopped her Furosemide, decreased her Losartan to once a day and decreased the Metoprolol back to 75 mg BID approximately 1 week ago.  Pt aware Dr Anne Fu not in the office tomorrow however he will be back on Thursday afternoon.  She would like to further reduce her Metoprolol to 50 mg BID.  Advised her not to do that at this time d/t HR remaining over 100 HR, and needing to further discuss with MD.  She denies any s/s other than fatigue.

## 2014-02-22 NOTE — Telephone Encounter (Signed)
Decrease spironolactone to 12.5mg  PO QD Decrease losartan to 12.5mg  PO QD.  Let try to continue metoprolol at current dose secondary to tachycardia.  Please call with update in 3-4 days  Donato Schultz, MD

## 2014-02-24 NOTE — Telephone Encounter (Signed)
Pt aware of Dr Anne Fu' orders.  She will follow them and continue to monitor her HR and BP.  She will call back early next week to let us know how she is doing.

## 2014-03-04 ENCOUNTER — Encounter (HOSPITAL_COMMUNITY): Payer: Self-pay | Admitting: Emergency Medicine

## 2014-03-04 ENCOUNTER — Emergency Department (HOSPITAL_COMMUNITY)
Admission: EM | Admit: 2014-03-04 | Discharge: 2014-03-04 | Disposition: A | Discharge status: Home / Self Care | Payer: Commercial Managed Care - HMO | Source: Home / Self Care

## 2014-03-04 ENCOUNTER — Emergency Department (INDEPENDENT_AMBULATORY_CARE_PROVIDER_SITE_OTHER): Payer: Commercial Managed Care - HMO

## 2014-03-04 DIAGNOSIS — M779 Enthesopathy, unspecified: Principal | ICD-10-CM

## 2014-03-04 DIAGNOSIS — M65849 Other synovitis and tenosynovitis, unspecified hand: Secondary | ICD-10-CM

## 2014-03-04 DIAGNOSIS — M65839 Other synovitis and tenosynovitis, unspecified forearm: Secondary | ICD-10-CM

## 2014-03-04 DIAGNOSIS — M778 Other enthesopathies, not elsewhere classified: Secondary | ICD-10-CM

## 2014-03-04 LAB — CBC
HCT: 43.8 % (ref 36.0–46.0)
Hemoglobin: 14.8 g/dL (ref 12.0–15.0)
MCH: 32.9 pg (ref 26.0–34.0)
MCHC: 33.8 g/dL (ref 30.0–36.0)
MCV: 97.3 fL (ref 78.0–100.0)
Platelets: 386 10*3/uL (ref 150–400)
RBC: 4.5 MIL/uL (ref 3.87–5.11)
RDW: 14.3 % (ref 11.5–15.5)
WBC: 14.8 10*3/uL — AB (ref 4.0–10.5)

## 2014-03-04 LAB — URIC ACID: Uric Acid, Serum: 3.9 mg/dL (ref 2.4–7.0)

## 2014-03-04 MED ORDER — TETANUS-DIPHTH-ACELL PERTUSSIS 5-2.5-18.5 LF-MCG/0.5 IM SUSP: 0.5000 mL | Freq: Once | INTRAMUSCULAR | Status: DC

## 2014-03-04 MED ORDER — DICLOFENAC 18 MG PO CAPS: 18.0000 mg | ORAL_CAPSULE | Freq: Two times a day (BID) | ORAL | Status: DC

## 2014-03-04 NOTE — ED Notes (Signed)
Pt  Has  Some  Pain  And  Swelling  Of  The  l hand  With  Some  Numbness  In the  l  Hand    That  She  Noticed  Last  Pm        Pt  denys  Any  Injury      She  Has   Some  Redness  And  puffynees  Of the  l  Middle    Knuckle

## 2014-03-04 NOTE — ED Provider Notes (Signed)
CSN: [id number]     Arrival date & time 03/04/14  1222 History   None    Chief Complaint  Patient presents with  . Hand Problem   (Consider location/radiation/quality/duration/timing/severity/associated sxs/prior Treatment) Patient is a 73 y.o. female presenting with hand pain. The history is provided by the patient.  Hand Pain This is a new problem. The current episode started 12 to 24 hours ago (reading a book last eve and cramp to lmf, resulting in sts and pain with rom of mcp joint.). The problem has been gradually improving. Pertinent negatives include no chest pain and no abdominal pain. The symptoms are aggravated by bending.    Past Medical History  Diagnosis Date  . COPD (chronic obstructive pulmonary disease)   . CAD (coronary artery disease)   . CHF (congestive heart failure)     EF of 25%.   Marland Kitchen NICM (nonischemic cardiomyopathy)   . Anxiety and depression   . High blood pressure   . Heart attack   . High cholesterol   . H/O blood clots    Past Surgical History  Procedure Laterality Date  . Transthoracic echocardiogram  11/2005  . Status post pacemaker ablation    . Total abdominal hysterectomy    . 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 Esophagus surgery      for acid reflux disease  . Cholecystectomy    . Cardiac defibrillator placement  2006, 2010   Family History  Problem Relation Age of Onset  . Dementia Mother 57  . Ulcers Father 37    perforative  . Heart disease Brother 8  . Heart disease Brother   . Emphysema Mother    History  Substance Use Topics  . Smoking status: Current Every Day Smoker -- 0.50 packs/day for 50 years    Types: Cigarettes  . Smokeless tobacco: Not on file  . Alcohol Use: No   OB History   Grav Para Term Preterm Abortions TAB SAB Ect Mult Living                 Review of Systems  Constitutional: Negative.   Cardiovascular: Negative for chest pain.    Gastrointestinal: Negative for abdominal pain.  Musculoskeletal: Positive for joint swelling.  Skin: Negative.     Allergies  Nitroglycerin er  Home Medications   Prior to Admission medications   Medication Sig Start Date End Date Taking? Authorizing Provider  albuterol (VENTOLIN HFA) 108 (90 BASE) MCG/ACT inhaler Inhale into the lungs every 4 (four) hours as needed for shortness of breath.     Historical Provider, MD  aspirin EC 81 MG tablet Take 81 mg by mouth daily.    Historical Provider, MD  Diclofenac (ZORVOLEX) 18 MG CAPS Take 18 mg by mouth 2 (two) times daily after a meal. 03/04/14   Linna Hoff, MD  fluticasone (FLONASE) 50 MCG/ACT nasal spray Place 2 sprays into both nostrils 2 (two) times daily. 11/11/13   Leslye Peer, MD  furosemide (LASIX) 20 MG tablet Take 1 tablet (20 mg total) by mouth daily. 08/25/13   Dyann Kief, PA-C  ipratropium-albuterol (DUONEB) 0.5-2.5 (3) MG/3ML SOLN Take 3 mLs by nebulization 4 (four) times daily.      Historical Provider, MD  loratadine (CLARITIN) 10 MG tablet Take 1 tablet (10 mg total) by mouth daily. 11/11/13   Leslye Peer, MD  LORazepam (ATIVAN) 0.5  MG tablet Take 0.5 mg by mouth 3 (three) times daily as needed.      Historical Provider, MD  losartan (COZAAR) 25 MG tablet Take 25 mg by mouth 2 (two) times daily.     Historical Provider, MD  methylPREDNISolone (MEDROL) 4 MG tablet Take 4 mg by mouth daily. Take for 6 days, 1 pack, no refills, starting 02/16/14    Historical Provider, MD  metoprolol (LOPRESSOR) 50 MG tablet Take 2 tablets (100 mg total) by mouth 2 (two) times daily. 12/30/13   Donato SchultzMark Skains, MD  NON FORMULARY Sleeps with 3 L 02    Historical Provider, MD  omeprazole (PRILOSEC) 40 MG capsule Take 40 mg by mouth as needed (for heartburn).     Historical Provider, MD  predniSONE (DELTASONE) 5 MG tablet Take 1 tablet by mouth daily. 08/27/13   Historical Provider, MD  Spacer/Aero-Holding Chambers (AEROCHAMBER MV) inhaler Use as  instructed 10/07/13   Leslye Peerobert S Byrum, MD  spironolactone (ALDACTONE) 25 MG tablet Take 25 mg by mouth daily.     Historical Provider, MD   BP 114/78  Pulse 100  Temp(Src) 98.1 F (36.7 C) (Oral)  Resp 16  SpO2 95% Physical Exam  Nursing note and vitals reviewed. Constitutional: She is oriented to person, place, and time. She appears well-developed and well-nourished.  Musculoskeletal: She exhibits tenderness.       Hands: Neurological: She is alert and oriented to person, place, and time.  Skin: There is erythema.    ED Course  Procedures (including critical care time) Labs Review Labs Reviewed  CBC - Abnormal; Notable for the following:    WBC 14.8 (*)    All other components within normal limits  URIC ACID    Imaging Review Dg Hand Complete Left  03/04/2014   CLINICAL DATA:  Hand problem, pain  EXAM: LEFT HAND - COMPLETE 3+ VIEW  COMPARISON:  None.  FINDINGS: The left hand demonstrates no fracture or dislocation. There is generalized osteopenia. 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 There is no soft tissue swelling.  IMPRESSION: No acute osseous injury of the left hand.   Electronically Signed   By: Elige KoHetal  Patel   On: 03/04/2014 14:19    X-rays reviewed and report per radiologist.  MDM   1. Tendonitis of finger        Linna HoffJames D Cylas Falzone, MD 03/04/14 1455

## 2014-03-04 NOTE — Discharge Instructions (Signed)
Ice, keep splinted and use medicine as prescribed, see dr Merlyn Lot next week for recheck,

## 2014-03-30 ENCOUNTER — Encounter: Payer: Self-pay | Admitting: Internal Medicine

## 2014-04-22 ENCOUNTER — Other Ambulatory Visit: Payer: Self-pay | Admitting: Physician Assistant

## 2014-04-27 ENCOUNTER — Other Ambulatory Visit: Payer: Self-pay | Admitting: Internal Medicine

## 2014-04-27 DIAGNOSIS — Z1231 Encounter for screening mammogram for malignant neoplasm of breast: Secondary | ICD-10-CM

## 2014-05-10 ENCOUNTER — Other Ambulatory Visit: Payer: Self-pay

## 2014-05-10 MED ORDER — FUROSEMIDE 20 MG PO TABS: 20.0000 mg | ORAL_TABLET | Freq: Every day | ORAL | Status: DC

## 2014-05-12 ENCOUNTER — Ambulatory Visit
Admission: RE | Admit: 2014-05-12 | Discharge: 2014-05-12 | Disposition: A | Discharge status: Home / Self Care | Payer: Commercial Managed Care - HMO | Source: Ambulatory Visit | Attending: Internal Medicine | Admitting: Internal Medicine

## 2014-05-12 DIAGNOSIS — Z1231 Encounter for screening mammogram for malignant neoplasm of breast: Secondary | ICD-10-CM

## 2014-05-19 ENCOUNTER — Ambulatory Visit (INDEPENDENT_AMBULATORY_CARE_PROVIDER_SITE_OTHER): Payer: Commercial Managed Care - HMO | Admitting: *Deleted

## 2014-05-19 ENCOUNTER — Encounter: Payer: Self-pay | Admitting: Internal Medicine

## 2014-05-19 DIAGNOSIS — I4729 Other ventricular tachycardia: Secondary | ICD-10-CM

## 2014-05-19 DIAGNOSIS — I472 Ventricular tachycardia: Secondary | ICD-10-CM

## 2014-05-19 DIAGNOSIS — I428 Other cardiomyopathies: Secondary | ICD-10-CM

## 2014-05-19 DIAGNOSIS — I5022 Chronic systolic (congestive) heart failure: Secondary | ICD-10-CM

## 2014-05-19 LAB — MDC_IDC_ENUM_SESS_TYPE_INCLINIC
Battery Remaining Longevity: 57.6 mo
Brady Statistic RA Percent Paced: 0 %
Date Time Interrogation Session: 20151015175429
HIGH POWER IMPEDANCE MEASURED VALUE: 61.2837
Lead Channel Impedance Value: 462.5 Ohm
Lead Channel Impedance Value: 612.5 Ohm
Lead Channel Pacing Threshold Amplitude: 0.5 V
Lead Channel Pacing Threshold Amplitude: 0.75 V
Lead Channel Pacing Threshold Amplitude: 0.75 V
Lead Channel Pacing Threshold Pulse Width: 0.5 ms
Lead Channel Pacing Threshold Pulse Width: 0.5 ms
Lead Channel Pacing Threshold Pulse Width: 0.5 ms
Lead Channel Sensing Intrinsic Amplitude: 12 mV
Lead Channel Setting Pacing Amplitude: 2 V
Lead Channel Setting Pacing Amplitude: 2.5 V
Lead Channel Setting Sensing Sensitivity: 0.5 mV
MDC IDC MSMT LEADCHNL RA PACING THRESHOLD AMPLITUDE: 0.5 V
MDC IDC MSMT LEADCHNL RA PACING THRESHOLD PULSEWIDTH: 0.5 ms
MDC IDC MSMT LEADCHNL RA SENSING INTR AMPL: 5 mV
MDC IDC PG SERIAL: 745555
MDC IDC SET LEADCHNL RV PACING PULSEWIDTH: 0.5 ms
MDC IDC STAT BRADY RV PERCENT PACED: 0 %
Zone Setting Detection Interval: 300 ms
Zone Setting Detection Interval: 340 ms

## 2014-05-19 NOTE — Progress Notes (Signed)
ICD check in clinic. Normal device function. Thresholds and sensing consistent with previous device measurements. Impedance trends stable over time. No evidence of any ventricular arrhythmias. 262 mode switches (<1%)---max dur. 19 mins, Max A 244, Max V 151, Last 10/15 + ASA---(1) AMS from 10/12 ?NSVT x 7 bts. Stable thoracic impedance---last abn x 7 days in September. Histogram distribution appropriate for patient and level of activity. No changes made this session. Device programmed at appropriate safety margins. Device programmed to optimize intrinsic conduction. Estimated longevity 4.6 years. Pt enrolled in remote follow-up. Plan to follow up with [facility] in 09-2014. Patient education completed including shock plan. Vibration demonstrated for patient.

## 2014-05-20 ENCOUNTER — Other Ambulatory Visit: Payer: Self-pay

## 2014-06-06 ENCOUNTER — Telehealth: Payer: Self-pay | Admitting: Cardiology

## 2014-06-06 NOTE — Telephone Encounter (Signed)
Spoke with patient who states she has had fast irregular heart rate for a while now; states was advised to take Metoprolol 100 mg BID several months ago and states it has not helped. Patient reports heart rate goes up with activity and she gets SOB; states heart rate was 111bpm last night.  Patient states "I feel like I am going into failure again."  Patient was supposed to f/u in 6 months from March 2015 visit.  Has appointment scheduled for December.  I was able to move patient's appointment to tomorrow 11/3 with Dr. Anne Fu.  Patient states she would like to come in to see him tomorrow and thanked me for my help.

## 2014-06-06 NOTE — Telephone Encounter (Signed)
Thank you :)

## 2014-06-06 NOTE — Telephone Encounter (Signed)
New message    Patient calling C/O heart rates varies  High  90-low 100's .  Extremely sob.

## 2014-06-07 ENCOUNTER — Ambulatory Visit (INDEPENDENT_AMBULATORY_CARE_PROVIDER_SITE_OTHER): Payer: Commercial Managed Care - HMO | Admitting: Cardiology

## 2014-06-07 ENCOUNTER — Ambulatory Visit (INDEPENDENT_AMBULATORY_CARE_PROVIDER_SITE_OTHER): Payer: Commercial Managed Care - HMO | Admitting: Emergency Medicine

## 2014-06-07 ENCOUNTER — Encounter: Payer: Self-pay | Admitting: Emergency Medicine

## 2014-06-07 ENCOUNTER — Encounter: Payer: Self-pay | Admitting: Cardiology

## 2014-06-07 VITALS — BP 86/60 | HR 100 | Temp 98.5°F | Ht 66.0 in | Wt 148.0 lb

## 2014-06-07 VITALS — BP 114/82 | HR 94 | Ht 65.0 in | Wt 148.1 lb

## 2014-06-07 DIAGNOSIS — R000 Tachycardia, unspecified: Secondary | ICD-10-CM

## 2014-06-07 DIAGNOSIS — Z79899 Other long term (current) drug therapy: Secondary | ICD-10-CM

## 2014-06-07 DIAGNOSIS — J449 Chronic obstructive pulmonary disease, unspecified: Secondary | ICD-10-CM

## 2014-06-07 DIAGNOSIS — I5022 Chronic systolic (congestive) heart failure: Secondary | ICD-10-CM

## 2014-06-07 DIAGNOSIS — I471 Supraventricular tachycardia: Secondary | ICD-10-CM

## 2014-06-07 DIAGNOSIS — J438 Other emphysema: Secondary | ICD-10-CM | POA: Insufficient documentation

## 2014-06-07 DIAGNOSIS — R0602 Shortness of breath: Secondary | ICD-10-CM

## 2014-06-07 DIAGNOSIS — I480 Paroxysmal atrial fibrillation: Secondary | ICD-10-CM

## 2014-06-07 LAB — BRAIN NATRIURETIC PEPTIDE: PRO B NATRI PEPTIDE: 71 pg/mL (ref 0.0–100.0)

## 2014-06-07 LAB — BASIC METABOLIC PANEL
BUN: 19 mg/dL (ref 6–23)
CALCIUM: 9.3 mg/dL (ref 8.4–10.5)
CHLORIDE: 99 meq/L (ref 96–112)
CO2: 31 mEq/L (ref 19–32)
CREATININE: 0.9 mg/dL (ref 0.4–1.2)
GFR: 68.46 mL/min (ref >60.00)
Glucose, Bld: 79 mg/dL (ref 70–99)
Potassium: 3.7 mEq/L (ref 3.5–5.1)
SODIUM: 139 meq/L (ref 135–145)

## 2014-06-07 LAB — CBC
HCT: 47.9 % — ABNORMAL HIGH (ref 36.0–46.0)
HEMOGLOBIN: 15.6 g/dL — AB (ref 12.0–15.0)
MCHC: 32.6 g/dL (ref 30.0–36.0)
MCV: 99 fl (ref 78.0–100.0)
Platelets: 365 10*3/uL (ref 150.0–400.0)
RBC: 4.83 Mil/uL (ref 3.87–5.11)
RDW: 13.3 % (ref 11.5–15.5)

## 2014-06-07 MED ORDER — CARVEDILOL 25 MG PO TABS: 25.0000 mg | ORAL_TABLET | Freq: Two times a day (BID) | ORAL | Status: AC

## 2014-06-07 MED ORDER — FUROSEMIDE 40 MG PO TABS: 40.0000 mg | ORAL_TABLET | Freq: Every day | ORAL | Status: AC

## 2014-06-07 MED ORDER — BUPROPION HCL ER (XL) 150 MG PO TB24: ORAL_TABLET | ORAL | Status: AC

## 2014-06-07 MED ORDER — NICOTINE 7 MG/24HR TD PT24: 7.0000 mg | MEDICATED_PATCH | Freq: Every day | TRANSDERMAL | Status: AC

## 2014-06-07 MED ORDER — PREDNISONE 10 MG PO TABS: ORAL_TABLET | ORAL | Status: AC

## 2014-06-07 NOTE — Progress Notes (Signed)
[address] [place], [place]  [address] Phone: [phone] Fax:  [phone]  Date:  06/07/2014   ID:  [name], DOB [birth year], MRN [id number]  PCP:  Georgann Housekeeper, MD   History of Present Illness: [name] is a 73 y.o. female with chronic systolic heart failure, CAD, ventricular tachycardia, COPD post ICD implantation, smoker here for followup. She called the office 06/06/14 with complaints of fast, irregular heartbeat for quite some time. She has been taking metoprolol 100 mg twice a day and this has not helped. Her heart rate goes up with activity and she becomes short of breath. It was 111 bpm the previous night. She felt as though she was going into heart failure.  Atrial fibrillation, maintaining sinus rhythm on current medications and her ICD is working properly.  Recently has been having difficulty maintaining her heart rate. It is apparently 100. Shortness of breath and increased fatigue with minimal exertion. No chest pain. She states that she has trouble quitting smoking.  Wt Readings from Last 3 Encounters:  06/07/14 148 lb 1.9 oz (67.187 kg)  11/11/13 135 lb 6.4 oz (61.417 kg)  11/02/13 135 lb (61.236 kg)     Past Medical History  Diagnosis Date  . COPD (chronic obstructive pulmonary disease)   . CAD (coronary artery disease)   . CHF (congestive heart failure)     EF of 25%.   Marland Kitchen NICM (nonischemic cardiomyopathy)   . Anxiety and depression   . High blood pressure   . Heart attack   . High cholesterol   . H/O blood clots     Past Surgical History  Procedure Laterality Date  . Transthoracic echocardiogram  11/2005  . Status post pacemaker ablation    . Total abdominal hysterectomy    . 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 Esophagus surgery      for acid reflux disease  . Cholecystectomy    . Cardiac defibrillator placement  2006, 2010    Current Outpatient  Prescriptions  Medication Sig Dispense Refill  . albuterol (VENTOLIN HFA) 108 (90 BASE) MCG/ACT inhaler Inhale into the lungs every 4 (four) hours as needed for shortness of breath.     Marland Kitchen aspirin EC 81 MG tablet Take 81 mg by mouth daily.    . fluticasone (FLONASE) 50 MCG/ACT nasal spray Place 2 sprays into both nostrils 2 (two) times daily. 16 g 11  . FLUZONE HIGH-DOSE 0.5 ML SUSY   0  . furosemide (LASIX) 20 MG tablet Take 1 tablet (20 mg total) by mouth daily. 90 tablet 0  . ipratropium-albuterol (DUONEB) 0.5-2.5 (3) MG/3ML SOLN Take 3 mLs by nebulization 4 (four) times daily.      Marland Kitchen loratadine (CLARITIN) 10 MG tablet Take 1 tablet (10 mg total) by mouth daily. 30 tablet 11  . LORazepam (ATIVAN) 0.5 MG tablet Take 0.5 mg by mouth 3 (three) times daily as needed.      Marland Kitchen losartan (COZAAR) 25 MG tablet Take 25 mg by mouth 2 (two) times daily.     . metoprolol (LOPRESSOR) 50 MG tablet Take 2 tablets (100 mg total) by mouth 2 (two) times daily. 120 tablet 5  . NON FORMULARY Sleeps with 3 L 02    . omeprazole (PRILOSEC) 40 MG capsule Take 40 mg by mouth as needed (for heartburn).     . predniSONE (  DELTASONE) 10 MG tablet   11  . predniSONE (DELTASONE) 5 MG tablet   3  . Spacer/Aero-Holding Chambers (AEROCHAMBER MV) inhaler Use as instructed 1 each 0  . spironolactone (ALDACTONE) 25 MG tablet Take 25 mg by mouth daily.      No current facility-administered medications for this visit.    Allergies:    Allergies  Allergen Reactions  . Nitroglycerin Er Other (See Comments)    Blood pressure bottoms out    Social History:  The patient  reports that she has been smoking Cigarettes.  She has a 25 pack-year smoking history. She does not have any smokeless tobacco history on file. She reports that she does not drink alcohol or use illicit drugs.   ROS:  Please see the history of present illness.   Denies any syncope, orthopnea, PND, chest pain.  PHYSICAL EXAM: VS:  BP 114/82 mmHg  Pulse 94   Ht 5\' 5"  (1.651 m)  Wt 148 lb 1.9 oz (67.187 kg)  BMI 24.65 kg/m2 Well nourished, well developed, in no acute distress HEENT: normal Neck: no JVD Cardiac:  normal S1, S2; RRR; no murmur Lungs:  + wheezing bilaterally, no rhonchi or ralesSomewhat distant breath sounds from COPD Abd: soft, nontender, no hepatomegaly Ext: no edema Skin: warm and dry Neuro: no focal abnormalities noted  EKG:  06/07/14-sinus rhythm, 94, old septal infarct, no other abnormalities. No evidence of atrial fibrillation.Pacemaker interrogation reviewed     ASSESSMENT AND PLAN:  1. Tachycardia-I had increased her metoprolol to 100 mg twice a day from 75 mg twice a day previously however she remains tachycardic and short of breath. Her dyspnea of course can be multifactorial given her COPD.I will check proBNP, CBC, BMET .I will check echocardiogram. Increase furosemide to 40 mg once a day.weight gain from 135-148, eating too much she states. 2. Atrial fibrillation-maintaining sinus rhythm. 3. Coronary artery disease-no active anginal symptoms. 4. Tobacco use-she is going to work with Dr. Delton CoombesByrum on tobacco cessation. 5. COPD-severe. Likely limiting her symptoms as well. Lungs are tight this morning. 6. Chronic systolic heart failure-uptitrating beta blocker to help reduce heart rate. Changing from metoprolol to carvedilol at her request. 7. We will see her back in one week.  Signed, Donato SchultzMark Skains, MD [facility]  06/07/2014 8:14 AM

## 2014-06-07 NOTE — Patient Instructions (Signed)
Continue your DuoNebs 4 times a day Use albuterol as needed.  Take prednisone taper as directed.  We will start wellbutrin and nicotine patches 7mg  to help you prepare for your quit date on 06/15/14.  Use 1-800-QUIT-NOW line for support Call our office for support as well.

## 2014-06-07 NOTE — Assessment & Plan Note (Signed)
Not clear to me that she is flaring. Instead I believe that this reflects her chronic dyspnea from her COPD. She is interested in stopping smoking. She is also interested in prednisone taper given her worsening short windedness .- Will start prednisone taper - will start wellbutrin - will start nicotine patches 7mg  - quit date 06/15/14 > counseling given - rov 1

## 2014-06-07 NOTE — Progress Notes (Signed)
Subjective:    Patient ID: [name], female    DOB: 03/01/1941, 73 y.o.   MRN: [id number]  HPI 73 yo woman, smoker, HTN, CAD, CHF, AICD. Also had LV thrombus in the 90's, anticoagulated at the time but not currently. She also has a hx of resp failure in the past for which she was intubated '09. She is referred for dyspnea, smoking and COPD. She is on Spiriva for 13 yrs, uses Duonebs on a schedule. She uses SABA 0-3x a day. She has O2 that she uses prn.   No wheeze, occas cough. Good exertional tolerance w chores, sometimes rests when cooking, able to shop.   She heated w coal as a youngster, has second hand smoke exposure.    ROV 10/07/13 -- follows up for tobacco and COPD. 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 Last time we continued duonebs, stopped spiriva. She doesn't miss it. She is using SABA occasionally. She does worse in the warm humid weather. She has some cough and wheeze but these are stable.   ROV 11/11/13 -- follows for COPD, severe AFL. Continues to smoke. She also has hx CAD, AICD for VT, CHF.  Her metoprolol was increased to 100 bid on 3/31.  For last 4-5 days nasal congestion, dry cough, some wheeze at night when she lays down.  She is on Pred 5 qd for 4-5 months, duonebs qid.   ROV 06/07/14 -- follows up for tobacco abuse, severe COPD, also followed by Dr Anne FuSkains for CAD, AICD, CHF. She was just seen in Dr Anne FuSkains of and described more tachycardia and dyspnea over last couple weeks. Her lasix was increased, changed metoprolol to carvedilol. She is on prednisone that she uses prn for cervical stenosis and L hand sx, averages about 3x a week. She uses albuterol 3x a day, DuoNebs on a schedule.    Review of Systems  Constitutional: Negative for fever and unexpected weight change.  HENT: Negative for congestion, dental problem, ear pain, nosebleeds, postnasal drip, rhinorrhea, sinus pressure, sneezing, sore throat and trouble swallowing.   Eyes: Negative  for redness and itching.  Respiratory: Positive for cough and shortness of breath. Negative for chest tightness and wheezing.   Cardiovascular: Positive for palpitations. Negative for leg swelling.  Gastrointestinal: Negative for nausea and vomiting.  Genitourinary: Negative for dysuria.  Musculoskeletal: Negative for joint swelling.  Skin: Negative for rash.  Neurological: Negative for headaches.  Hematological: Does not bruise/bleed easily.  Psychiatric/Behavioral: Negative for dysphoric mood. The patient is nervous/anxious.        Objective:   Physical Exam Filed Vitals:   06/07/14 1057  BP: 86/60  Pulse: 100  Temp: 98.5 F (36.9 C)  TempSrc: Oral  Height: 5\' 6"  (1.676 m)  Weight: 148 lb (67.132 kg)  SpO2: 97%   Gen: Pleasant, thin, in no distress,  normal affect  ENT: No lesions,  mouth clear,  oropharynx clear, no postnasal drip  Neck: No JVD, no TMG, no carotid bruits  Lungs: No use of accessory muscles, distant, clear on normal resp, wheeze on a forced exp  Cardiovascular: RRR, heart sounds normal, no murmur or gallops, no peripheral edema  Musculoskeletal: No deformities, no cyanosis or clubbing  Neuro: alert, non focal  Skin: Warm, no lesions or rash      Assessment & Plan:  COPD (chronic obstructive pulmonary disease) Not clear to me that she is flaring. Instead I believe that this reflects her chronic dyspnea from her  COPD. She is interested in stopping smoking. She is also interested in prednisone taper given her worsening short windedness .- Will start prednisone taper - will start wellbutrin - will start nicotine patches 7mg  - quit date 06/15/14 > counseling given - rov 1

## 2014-06-07 NOTE — Patient Instructions (Signed)
Please stop your Metoprolol. Start Carvedilol 25 mg one tablet twice a day. Increase your Lasix (Furosemide) to 40 mg a day. Continue all other medications as listed.  Please have blood work today. (CBC, BNP AND BMP)  Follow up in 1 week with Dr Anne Fu.

## 2014-06-10 ENCOUNTER — Ambulatory Visit (HOSPITAL_COMMUNITY): Payer: Medicare HMO | Attending: Cardiovascular Disease | Admitting: Radiology

## 2014-06-10 DIAGNOSIS — R0600 Dyspnea, unspecified: Secondary | ICD-10-CM | POA: Diagnosis not present

## 2014-06-10 DIAGNOSIS — J449 Chronic obstructive pulmonary disease, unspecified: Secondary | ICD-10-CM | POA: Insufficient documentation

## 2014-06-10 DIAGNOSIS — R0602 Shortness of breath: Secondary | ICD-10-CM

## 2014-06-10 NOTE — Progress Notes (Signed)
Echocardiogram performed.  

## 2014-06-16 ENCOUNTER — Ambulatory Visit: Payer: Commercial Managed Care - HMO | Admitting: Cardiology

## 2014-06-23 ENCOUNTER — Encounter: Payer: Self-pay | Admitting: Cardiology

## 2014-06-28 ENCOUNTER — Telehealth: Payer: Self-pay

## 2014-06-28 NOTE — Telephone Encounter (Signed)
Patient died per Obituary °

## 2014-07-05 DEATH — deceased

## 2014-07-08 ENCOUNTER — Ambulatory Visit: Payer: Commercial Managed Care - HMO | Admitting: Cardiology
